# Patient Record
Sex: Male | Born: 1975 | Race: Black or African American | Hispanic: No | Marital: Married | Smoking: Former smoker
Health system: Southern US, Community
[De-identification: ages and names within clinical notes are randomized; demographics above are authoritative.]

## PROBLEM LIST (undated history)

## (undated) DIAGNOSIS — M5090 Cervical disc disorder, unspecified, unspecified cervical region: Secondary | ICD-10-CM

## (undated) DIAGNOSIS — R9431 Abnormal electrocardiogram [ECG] [EKG]: Secondary | ICD-10-CM

## (undated) DIAGNOSIS — R030 Elevated blood-pressure reading, without diagnosis of hypertension: Secondary | ICD-10-CM

## (undated) DIAGNOSIS — R600 Localized edema: Secondary | ICD-10-CM

## (undated) DIAGNOSIS — Z87891 Personal history of nicotine dependence: Secondary | ICD-10-CM

## (undated) DIAGNOSIS — E785 Hyperlipidemia, unspecified: Secondary | ICD-10-CM

## (undated) HISTORY — DX: Abnormal electrocardiogram (ECG) (EKG): R94.31

## (undated) HISTORY — DX: Localized edema: R60.0

## (undated) HISTORY — DX: Personal history of nicotine dependence: Z87.891

## (undated) HISTORY — DX: Elevated blood-pressure reading, without diagnosis of hypertension: R03.0

## (undated) HISTORY — DX: Hyperlipidemia, unspecified: E78.5

## (undated) HISTORY — DX: Cervical disc disorder, unspecified, unspecified cervical region: M50.90

---

## 1998-06-23 ENCOUNTER — Emergency Department (HOSPITAL_COMMUNITY): Admission: EM | Admit: 1998-06-23 | Discharge: 1998-06-23 | Payer: Self-pay | Admitting: Emergency Medicine

## 2001-09-02 ENCOUNTER — Encounter: Payer: Self-pay | Admitting: Specialist

## 2001-09-02 ENCOUNTER — Encounter: Admission: RE | Admit: 2001-09-02 | Discharge: 2001-09-02 | Payer: Self-pay | Admitting: Specialist

## 2005-02-17 ENCOUNTER — Emergency Department (HOSPITAL_COMMUNITY): Admission: EM | Admit: 2005-02-17 | Discharge: 2005-02-17 | Payer: Self-pay | Admitting: Emergency Medicine

## 2010-03-27 DIAGNOSIS — M5090 Cervical disc disorder, unspecified, unspecified cervical region: Secondary | ICD-10-CM

## 2010-03-27 HISTORY — PX: CERVICAL DISCECTOMY: SHX98

## 2010-03-27 HISTORY — DX: Cervical disc disorder, unspecified, unspecified cervical region: M50.90

## 2010-11-26 DIAGNOSIS — R600 Localized edema: Secondary | ICD-10-CM

## 2010-11-26 HISTORY — DX: Localized edema: R60.0

## 2010-12-26 DIAGNOSIS — R9431 Abnormal electrocardiogram [ECG] [EKG]: Secondary | ICD-10-CM

## 2010-12-26 HISTORY — DX: Abnormal electrocardiogram (ECG) (EKG): R94.31

## 2011-01-02 ENCOUNTER — Emergency Department (HOSPITAL_COMMUNITY): Payer: Commercial Managed Care - PPO

## 2011-01-02 ENCOUNTER — Observation Stay (HOSPITAL_COMMUNITY)
Admission: EM | Admit: 2011-01-02 | Discharge: 2011-01-04 | DRG: 948 | Disposition: A | Discharge status: Home / Self Care | Payer: Commercial Managed Care - PPO | Attending: Vascular Surgery | Admitting: Vascular Surgery

## 2011-01-02 DIAGNOSIS — R609 Edema, unspecified: Principal | ICD-10-CM | POA: Diagnosis present

## 2011-01-02 DIAGNOSIS — F17200 Nicotine dependence, unspecified, uncomplicated: Secondary | ICD-10-CM | POA: Diagnosis present

## 2011-01-02 DIAGNOSIS — M7989 Other specified soft tissue disorders: Secondary | ICD-10-CM

## 2011-01-02 LAB — CBC
HCT: 41.6 % (ref 39.0–52.0)
Hemoglobin: 14.9 g/dL (ref 13.0–17.0)
MCH: 30.9 pg (ref 26.0–34.0)
MCHC: 35.8 g/dL (ref 30.0–36.0)
MCV: 86.3 fL (ref 78.0–100.0)
Platelets: 179 10*3/uL (ref 150–400)
RDW: 13.3 % (ref 11.5–15.5)
WBC: 3.6 10*3/uL — ABNORMAL LOW (ref 4.0–10.5)

## 2011-01-03 ENCOUNTER — Inpatient Hospital Stay (HOSPITAL_COMMUNITY): Payer: Commercial Managed Care - PPO

## 2011-01-03 DIAGNOSIS — M7989 Other specified soft tissue disorders: Secondary | ICD-10-CM

## 2011-01-03 LAB — HEPARIN LEVEL (UNFRACTIONATED)
Heparin Unfractionated: 0.93 IU/mL — ABNORMAL HIGH (ref 0.30–0.70)
Heparin Unfractionated: <0.1 IU/mL — ABNORMAL LOW (ref 0.30–0.70)

## 2011-01-03 LAB — BASIC METABOLIC PANEL
BUN: 15 mg/dL (ref 6–23)
CO2: 26 mEq/L (ref 19–32)
Calcium: 9.4 mg/dL (ref 8.4–10.5)
Chloride: 100 mEq/L (ref 96–112)
Creatinine, Ser: 0.83 mg/dL (ref 0.50–1.35)
GFR calc Af Amer: >90 mL/min (ref >90)
GFR calc non Af Amer: >90 mL/min (ref >90)
Glucose, Bld: 118 mg/dL — ABNORMAL HIGH (ref 70–99)
Potassium: 3.7 mEq/L (ref 3.5–5.1)
Sodium: 136 mEq/L (ref 135–145)

## 2011-01-03 LAB — PROTIME-INR: INR: 0.91 (ref 0.00–1.49)

## 2011-01-03 LAB — HOMOCYSTEINE: Homocysteine: 10.8 umol/L (ref 4.0–15.4)

## 2011-01-03 LAB — APTT: aPTT: 32 seconds (ref 24–37)

## 2011-01-03 MED ORDER — IOHEXOL 350 MG/ML SOLN
120.0000 mL | Freq: Once | INTRAVENOUS | Status: AC | PRN
  Administered 2011-01-03: 120 mL via INTRAVENOUS

## 2011-01-04 LAB — PROTEIN C, TOTAL: Protein C, Total: 141 % (ref 72–160)

## 2011-01-05 ENCOUNTER — Encounter: Payer: Self-pay | Admitting: Medical

## 2011-01-05 LAB — PROTEIN C ACTIVITY: Protein C Activity: 165 % — ABNORMAL HIGH (ref 75–133)

## 2011-01-05 LAB — LUPUS ANTICOAGULANT PANEL
Lupus Anticoagulant: NOT DETECTED
PTT Lupus Anticoagulant: 40 secs (ref 30.0–45.6)

## 2011-01-05 LAB — ANTITHROMBIN III: AntiThromb III Func: 87 % (ref 76–126)

## 2011-01-05 LAB — PROTEIN S ACTIVITY: Protein S Activity: 111 % (ref 69–129)

## 2011-01-06 LAB — FACTOR 5 LEIDEN

## 2011-01-09 ENCOUNTER — Emergency Department (HOSPITAL_COMMUNITY)
Admission: EM | Admit: 2011-01-09 | Discharge: 2011-01-09 | Disposition: A | Discharge status: Home / Self Care | Payer: Commercial Managed Care - PPO | Attending: Emergency Medicine | Admitting: Emergency Medicine

## 2011-01-09 ENCOUNTER — Ambulatory Visit: Payer: Commercial Managed Care - PPO | Admitting: Medical

## 2011-01-09 DIAGNOSIS — M542 Cervicalgia: Secondary | ICD-10-CM | POA: Insufficient documentation

## 2011-01-09 LAB — PROTHROMBIN GENE MUTATION

## 2011-01-09 NOTE — Consult Note (Signed)
[name], [name]           ACCOUNT NO.:  [id number]  MEDICAL RECORD NO.:  [id number]  LOCATION:  2007                         FACILITY:  [facility]  PHYSICIAN:  Troy Frankel. Charlann Lang, M.D.  DATE OF BIRTH:  Aug 12, 1975  DATE OF CONSULTATION:  01/03/2011 DATE OF DISCHARGE:                                CONSULTATION   REASON FOR CONSULTATION:  Unexplained right upper extremity swelling.  BRIEF HISTORY:  Troy Lang is a 35 year old male seen at the request of Vascular Surgery after he presented to the hospital yesterday with 24- hour episode of right upper extremity swelling after doing exercises. The patient was seen evaluated by Vascular Surgery and had an extensive and thorough workup to rule out an effort-induced thrombosis.  This has been ruled out and at this point, there is no evidence of any vascular lymphatic condition that could result in his current situation.  He reports to me no pain, no reported trauma, no shoulder pains, no pectoralis pain, no biceps pain in antecubital area.  He has full range of motion of shoulder.  He has had no recent bug bites.  He has no fevers, chills, night sweats.  He does not note any erythema or warmth.  He has not had an episode like this in the past on either upper extremity or lower extremities.  PAST MEDICAL HISTORY:  Negative.  PAST SURGICAL HISTORY:  Negative.  SOCIAL HISTORY:  He has occasional tobacco and negative alcohol.  He is married.  REVIEW OF SYSTEMS:  Per his admitting note with Vascular Surgery, there are no fevers, chills, night sweats, or other constitutional symptoms.  PHYSICAL EXAMINATION:  GENERAL:  He is awake, alert, and oriented seen at the hospital on room 2007 at [facility] VITAL SIGNS.  Stable.  He is afebrile. EXTREMITIES:  His right upper extremity compared to the left does reveal some asymmetric swelling, but no pitting edema.  He demonstrates full shoulder range of motion with minimal tenderness to palpation.   Normal elbow range of motion with no pain.  He has symmetric skin temperature on both the right and left upper extremities.  Grip strength normal.  He does have a bandage from previous catheterization for the assessment of his right subclavian region.  ASSESSMENT:  Unexplained right upper extremity swelling with an uncertain etiology, ruled out vascular condition.  PLAN:  At this point, I did not have a obvious musculoskeletal reason to explain why he would have this other than perhaps reactive inflammatory response to his exercise.  Other than continued observation, resting his upper extremity, he does not need to be treated in a sling, perhaps a 12- day tapered course of prednisone may alleviate some of this inflammatory process.  I will start him on that tonight.  I did discuss this with Dr. Darrick Penna.  We planned at this point to see him back in the office in 3 weeks, understanding that if his symptoms have improved, he can cancel the appointment only unless it recurs.  Further workup outpoint at this point would still be uncertain unless we are to evaluate or be able to identify an isolated area of pain that would focus our attention.  Questions were encouraged and reviewed with  him today.     Troy Lang, M.D.     MDO/MEDQ  D:  01/03/2011  T:  01/04/2011  Job:  [id number]  Electronically Signed by Durene Romans M.D. on 01/09/2011 08:49:57 AM

## 2011-01-09 NOTE — H&P (Signed)
  Troy Lang, [name]           ACCOUNT NO.:  [id number]  MEDICAL RECORD NO.:  [id number]  LOCATION:  2007                         FACILITY:  [facility]  PHYSICIAN:  Janetta Hora. Camarion Weier, MD  DATE OF BIRTH:  [birth year]  DATE OF ADMISSION:  01/02/2011 DATE OF DISCHARGE:                             HISTORY & PHYSICAL   CHIEF COMPLAINT:  Right upper extremity swelling.  HISTORY OF PRESENT ILLNESS:  The patient is a 35 year old male who has approximately 24-hour history of swelling in the right upper extremity. The patient has been doing upper body workouts at the gym for the last week and noticed his right arm began swelling yesterday.  He was seen by an Urgent Care earlier this evening and was transferred to [facility] for further evaluation.  He denies history of recent or prior trauma to the right arm or neck.  He has no family history of thrombophilia or effort thrombosis.  He denies pain or numbness in the arm.  He has no other medical problems except possible history of elevated cholesterol.  MEDICATIONS:  None.  ALLERGIES:  No known drug allergies.  SOCIAL HISTORY:  Occasional tobacco use.  Denies alcohol or drug use. He is married.  FAMILY HISTORY:  As above.  REVIEW OF SYSTEMS:  Denies history of GI bleeding, shortness of breath, chest pain.  HEENT:  Negative.  Denies history of skin rash. Denies recent weight loss or gain or fevers.  Denies musculoskeletal deformity or arthritis.  PHYSICAL EXAMINATION:  VITAL SIGNS:  Heart rate 86, blood pressure 126/76, respirations 16, temperature 98. GENERAL:  He is a black male, in no acute distress.  He is alert and oriented x3. HEENT:  Negative. NECK:  Has 2+ carotid pulses with no jugular venous distention. CHEST:  Clear to auscultation bilaterally with no collaterals. CARDIAC:  Regular rate and rhythm. ABDOMEN:  Soft, nontender, nondistended.  No masses. SKIN:  Has no rash. EXTREMITIES:  He has 2+ femoral pulses  and 2+ radial pulses bilaterally. Right upper extremity is diffusely swollen from the hand all the way to the level of the shoulder. NEUROLOGIC: Upper extremity and lower extremity is 5/5 motor with symmetric exam.  Sensation is intact.  ASSESSMENT:  Effort thrombosis, right upper extremity.  PLAN: 1. CT venogram of the chest to rule out cervical ribs as well as other     causes of possible axillary thrombosis and make sure this is not     related to mass effect. 2. Heparin drip. 3. We will make him n.p.o. after midnight for possible lysis in the     morning. 4. He will most likely need transfer to [facility] to be evaluated by Dr.     Ewing Schlein for possible thoracic outlet decompression at some point.     We will also order a  hypercoagulable panel this evening.     Janetta Hora. Galia Rahm, MD     CEF/MEDQ  D:  01/02/2011  T:  01/03/2011  Job:  [id number]  Electronically Signed by Fabienne Bruns MD on 01/09/2011 12:53:22 PM

## 2011-01-09 NOTE — H&P (Signed)
  Troy Lang, Troy Lang           ACCOUNT NO.:  [id number]  MEDICAL RECORD NO.:  [id number]  LOCATION:  [facility]                         FACILITY:  [facility]  PHYSICIAN:  Janetta Hora. Rivers Hamrick, MD  DATE OF BIRTH:  1975-10-24  DATE OF ADMISSION:  01/02/2011 DATE OF DISCHARGE:                             HISTORY & PHYSICAL   CHIEF COMPLAINT:  Right upper extremity swelling.  HISTORY OF PRESENT ILLNESS:  The patient is a 35 year old male with approximately 24-hour history of swelling in his right upper extremity. The patient has been doing upper body workouts over the last week at the gym.  He noticed right arm began to swell yesterday.  No prior trauma to the arm or neck.  He has no family history of thrombophilia or effort thrombosis.  __________ problems or history of possible elevation of cholesterol.   Dictation ended at this point.     Janetta Hora. Nalda Shackleford, MD     CEF/MEDQ  D:  01/02/2011  T:  01/02/2011  Job:  [id number]  Electronically Signed by Fabienne Bruns MD on 01/09/2011 12:53:31 PM

## 2011-01-13 LAB — BETA-2-GLYCOPROTEIN I ABS, IGG/M/A
Beta-2 Glyco I IgG: 0 G Units (ref <20)
Beta-2-Glycoprotein I IgA: 3 A Units (ref <20)
Beta-2-Glycoprotein I IgM: 5 M Units (ref <20)

## 2011-01-13 LAB — CARDIOLIPIN ANTIBODIES, IGG, IGM, IGA
Anticardiolipin IgA: 10 APL U/mL — ABNORMAL LOW (ref <22)
Anticardiolipin IgG: 4 GPL U/mL — ABNORMAL LOW (ref <23)
Anticardiolipin IgM: 3 MPL U/mL — ABNORMAL LOW (ref <11)

## 2011-01-23 ENCOUNTER — Encounter: Payer: Self-pay | Admitting: Medical

## 2011-01-23 ENCOUNTER — Ambulatory Visit (INDEPENDENT_AMBULATORY_CARE_PROVIDER_SITE_OTHER): Payer: Commercial Managed Care - PPO | Admitting: Medical

## 2011-01-23 VITALS — BP 130/88 | HR 72 | Temp 98.0°F | Resp 12 | Ht 76.0 in | Wt 222.0 lb

## 2011-01-23 DIAGNOSIS — G479 Sleep disorder, unspecified: Secondary | ICD-10-CM

## 2011-01-23 DIAGNOSIS — R404 Transient alteration of awareness: Secondary | ICD-10-CM

## 2011-01-23 DIAGNOSIS — Z0000 Encounter for general adult medical examination without abnormal findings: Secondary | ICD-10-CM

## 2011-01-23 DIAGNOSIS — R0683 Snoring: Secondary | ICD-10-CM

## 2011-01-23 DIAGNOSIS — Z113 Encounter for screening for infections with a predominantly sexual mode of transmission: Secondary | ICD-10-CM

## 2011-01-23 DIAGNOSIS — R0609 Other forms of dyspnea: Secondary | ICD-10-CM

## 2011-01-23 DIAGNOSIS — R400 Somnolence: Secondary | ICD-10-CM | POA: Insufficient documentation

## 2011-01-23 LAB — CBC WITH DIFFERENTIAL/PLATELET
Basophils Relative: 0 % (ref 0–1)
Eosinophils Absolute: 0.1 10*3/uL (ref 0.0–0.7)
HCT: 46 % (ref 39.0–52.0)
Hemoglobin: 16.1 g/dL (ref 13.0–17.0)
Lymphs Abs: 1.5 10*3/uL (ref 0.7–4.0)
MCH: 30.5 pg (ref 26.0–34.0)
MCHC: 35 g/dL (ref 30.0–36.0)
Monocytes Absolute: 0.6 10*3/uL (ref 0.1–1.0)
Monocytes Relative: 19 % — ABNORMAL HIGH (ref 3–12)
Neutrophils Relative %: 29 % — ABNORMAL LOW (ref 43–77)
RBC: 5.28 MIL/uL (ref 4.22–5.81)

## 2011-01-23 LAB — POCT URINALYSIS DIPSTICK
Bilirubin, UA: NEGATIVE
Ketones, UA: NEGATIVE
Leukocytes, UA: NEGATIVE
Spec Grav, UA: 1.02

## 2011-01-23 NOTE — Patient Instructions (Signed)
Preventative Care for Adults, Male       REGULAR HEALTH EXAMS:  A routine yearly physical is a good way to check in with your primary care provider about your health and preventive screening. It is also an opportunity to share updates about your health and any concerns you have, and receive a thorough all-over exam.   Most health insurance companies pay for at least some preventative services.  Check with your health plan for specific coverages.  WHAT PREVENTATIVE SERVICES DO MEN NEED?  Adult men should have their weight and blood pressure checked regularly.   Men age 35 and older should have their cholesterol levels checked regularly.  Beginning at age 50 and continuing to age 75, men should be screened for colorectal cancer.  Certain people should may need continued testing until age 85.  Other cancer screening may include exams for testicular and prostate cancer.  Updating vaccinations is part of preventative care.  Vaccinations help protect against diseases such as the flu.  Lab tests are generally done as part of preventative care to screen for anemia and blood disorders, to screen for problems with the kidneys and liver, to screen for bladder problems, to check blood sugar, and to check your cholesterol level.  Preventative services generally include counseling about diet, exercise, avoiding tobacco, drugs, excessive alcohol consumption, and sexually transmitted infections.    GENERAL RECOMMENDATIONS FOR GOOD HEALTH:  Healthy diet:  Eat a variety of foods, including fruit, vegetables, animal or vegetable protein, such as meat, fish, chicken, and eggs, or beans, lentils, tofu, and grains, such as rice.  Drink plenty of water daily.  Decrease saturated fat in the diet, avoid lots of red meat, processed foods, sweets, fast foods, and fried foods.  Exercise:  Aerobic exercise helps maintain good heart health. At least 30-40 minutes of moderate-intensity exercise is recommended.  For example, a brisk walk that increases your heart rate and breathing. This should be done on most days of the week.   Find a type of exercise or a variety of exercises that you enjoy so that it becomes a part of your daily life.  Examples are running, walking, swimming, water aerobics, and biking.  For motivation and support, explore group exercise such as aerobic class, spin class, Zumba, Yoga,or  martial arts, etc.    Set exercise goals for yourself, such as a certain weight goal, walk or run in a race such as a 5k walk/run.  Speak to your primary care provider about exercise goals.  Disease prevention:  If you smoke or chew tobacco, find out from your caregiver how to quit. It can literally save your life, no matter how long you have been a tobacco user. If you do not use tobacco, never begin.   Maintain a healthy diet and normal weight. Increased weight leads to problems with blood pressure and diabetes.   The Body Mass Index or BMI is a way of measuring how much of your body is fat. Having a BMI above 27 increases the risk of heart disease, diabetes, hypertension, stroke and other problems related to obesity. Your caregiver can help determine your BMI and based on it develop an exercise and dietary program to help you achieve or maintain this important measurement at a healthful level.  High blood pressure causes heart and blood vessel problems.  Persistent high blood pressure should be treated with medicine if weight loss and exercise do not work.   Fat and cholesterol leaves deposits in your arteries   that can block them. This causes heart disease and vessel disease elsewhere in your body.  If your cholesterol is found to be high, or if you have heart disease or certain other medical conditions, then you may need to have your cholesterol monitored frequently and be treated with medication.   Ask if you should have a stress test if your history suggests this. A stress test is a test done on  a treadmill that looks for heart disease. This test can find disease prior to there being a problem.  Avoid drinking alcohol in excess (more than two drinks per day).  Avoid use of street drugs. Do not share needles with anyone. Ask for professional help if you need assistance or instructions on stopping the use of alcohol, cigarettes, and/or drugs.  Brush your teeth twice a day with fluoride toothpaste, and floss once a day. Good oral hygiene prevents tooth decay and gum disease. The problems can be painful, unattractive, and can cause other health problems. Visit your dentist for a routine oral and dental check up and preventive care every 6-12 months.   Look at your skin regularly.  Use a mirror to look at your back. Notify your caregivers of changes in moles, especially if there are changes in shapes, colors, a size larger than a pencil eraser, an irregular border, or development of new moles.  Safety:  Use seatbelts 100% of the time, whether driving or as a passenger.  Use safety devices such as hearing protection if you work in environments with loud noise or significant background noise.  Use safety glasses when doing any work that could send debris in to the eyes.  Use a helmet if you ride a bike or motorcycle.  Use appropriate safety gear for contact sports.  Talk to your caregiver about gun safety.  Use sunscreen with a SPF (or skin protection factor) of 15 or greater.  Lighter skinned people are at a greater risk of skin cancer. Don't forget to also wear sunglasses in order to protect your eyes from too much damaging sunlight. Damaging sunlight can accelerate cataract formation.   Practice safe sex. Use condoms. Condoms are used for birth control and to help reduce the spread of sexually transmitted infections (or STIs).  Some of the STIs are gonorrhea (the clap), chlamydia, syphilis, trichomonas, herpes, HPV (human papilloma virus) and HIV (human immunodeficiency virus) which causes AIDS.  The herpes, HIV and HPV are viral illnesses that have no cure. These can result in disability, cancer and death.   Keep carbon monoxide and smoke detectors in your home functioning at all times. Change the batteries every 6 months or use a model that plugs into the wall.   Vaccinations:  Stay up to date with your tetanus shots and other required immunizations. You should have a booster for tetanus every 10 years. Be sure to get your flu shot every year, since 5%-20% of the [place] population comes down with the flu. The flu vaccine changes each year, so being vaccinated once is not enough. Get your shot in the fall, before the flu season peaks.   Other vaccines to consider:  Pneumococcal vaccine to protect against certain types of pneumonia.  This is normally recommended for adults age 65 or older.  However, adults younger than 35 years old with certain underlying conditions such as diabetes, heart or lung disease should also receive the vaccine.  Shingles vaccine to protect against Varicella Zoster if you are older than age 60, or younger   than 35 years old with certain underlying illness.  Hepatitis A vaccine to protect against a form of infection of the liver by a virus acquired from food.  Hepatitis B vaccine to protect against a form of infection of the liver by a virus acquired from blood or body fluids, particularly if you work in health care.  If you plan to travel internationally, check with your local health department for specific vaccination recommendations.  Cancer Screening:  Most routine colon cancer screening begins at the age of 50. On a yearly basis, doctors may provide special easy to use take-home tests to check for hidden blood in the stool. Sigmoidoscopy or colonoscopy can detect the earliest forms of colon cancer and is life saving. These tests use a small camera at the end of a tube to directly examine the colon. Speak to your caregiver about this at age 50, when routine  screening begins (and is repeated every 5 years unless early forms of pre-cancerous polyps or small growths are found).   At the age of 50 men usually start screening for prostate cancer every year. Screening may begin at a younger age for those with higher risk. Those at higher risk include African-Americans or having a family history of prostate cancer. There are two types of tests for prostate cancer:   Prostate-specific antigen (PSA) testing. Recent studies raise questions about prostate cancer using PSA and you should discuss this with your caregiver.   Digital rectal exam (in which your doctor's lubricated and gloved finger feels for enlargement of the prostate through the anus).   Screening for testicular cancer.  Do a monthly exam of your testicles. Gently roll each testicle between your thumb and fingers, feeling for any abnormal lumps. The best time to do this is after a hot shower or bath when the tissues are looser. Notify your caregivers of any lumps, tenderness or changes in size or shape immediately.     

## 2011-01-23 NOTE — Progress Notes (Signed)
Subjective:   HPI  Troy Lang is a 35 y.o. male who presents for a complete physical.  He is a new patient today.  His wife established with me as a new patient recently.  He is originally from [place], [place].  He has lived here since 1998.  He has been in his usual state of health until September.   He began having right arm swelling and neck pain, was hospitalized in September for edema, suspected DVT, but ultimately was found to have cervical disc disease.  He is currently on medication per [facility], and has surgery planned in the next few weeks.     His wife is with him today and notes that he has had bad snoring problems for at least  5 years that is worsening. He feels fatigue, has day time somnolence, doesn't feel rested, and wife thinks he has apnea episodes.   Nurse at the hospital recommended he have a sleep study.    Reviewed their medical, surgical, family, social, medication, and allergy history and updated chart as appropriate.  Past Medical History  Diagnosis Date  . Hyperlipidemia   . Cervical disc disease     Dr. Shon Baton  . Edema of upper extremity 9/12    hospitalization for edema  . Former smoker     History reviewed. No pertinent past surgical history.  Family History  Problem Relation Age of Onset  . Diabetes Mother   . Hypertension Mother   . Diabetes Father   . Hypertension Father   . Diabetes Brother   . Cancer Neg Hx   . COPD Neg Hx   . Heart disease Neg Hx   . Stroke Neg Hx     History   Social History  . Marital Status: Married    Spouse Name: N/A    Number of Children: N/A  . Years of Education: N/A   Occupational History  . Publishing rights manager   Social History Main Topics  . Smoking status: Former Smoker -- 0.5 packs/day for 10 years    Quit date: 09/23/2010  . Smokeless tobacco: Never Used   Comment: pt states that he has smoke one or 2 after that  . Alcohol Use: No  . Drug Use: No  . Sexually  Active: Not on file   Other Topics Concern  . 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    No Known Allergies     Review of Systems Constitutional: -fever, -chills, -sweats, -unexpected weight change, -anorexia, -fatigue Allergy: -sneezing, -itching, -congestion Dermatology: denies changing moles, rash, lumps, new worrisome lesions ENT: -runny nose, -ear pain, -sore throat, -hoarseness, -sinus pain, -teeth pain, -tinnitus, -hearing loss, -epistaxis Cardiology:  -chest pain, -palpitations, -edema, -orthopnea, -paroxysmal nocturnal dyspnea Respiratory: -cough, -shortness of breath, -dyspnea on exertion, -wheezing, -hemoptysis Gastroenterology: -abdominal pain, -nausea, -vomiting, -diarrhea, -constipation, -blood in stool, -changes in bowel movement, -dysphagia Hematology: -bleeding or bruising problems Musculoskeletal: -arthralgias, -myalgias, -joint swelling, -back pain, +neck pain, -cramping, -gait changes Ophthalmology: -vision changes, -eye redness, -itching, -discharge Urology: -dysuria, -difficulty urinating, -hematuria, -urinary frequency, -urgency, incontinence Neurology: -headache, -weakness, +tingling, +numbness, -speech abnormality, -memory loss, -falls, -dizziness Psychology:  -depressed mood, -agitation, -sleep problems     Objective:   Physical Exam  Filed Vitals:   01/23/11 1011  BP: 130/88  Pulse: 72  Temp: 98  F (36.7 C)  Resp: 12    General appearance: alert, no distress, WD/WN, black male, mildly overweight Skin: unremarkable, no worrisome lesions HEENT: normocephalic, conjunctiva/corneas normal, sclerae anicteric, PERRLA, EOMi, nares patent, no discharge or erythema, pharynx normal Oral cavity: MMM, tongue normal, teeth normal Neck: supple, no lymphadenopathy, no thyromegaly, no masses, mild  generalized tenderness, decreased generalized ROM, no bruits Chest: non tender, normal shape and expansion Heart: RRR, normal S1, S2, no murmurs Lungs: CTA bilaterally, no wheezes, rhonchi, or rales Abdomen: +bs, soft, non tender, non distended, no masses, no hepatomegaly, no splenomegaly, no bruits Back: non tender, normal ROM, no scoliosis Musculoskeletal: upper extremities non tender, no obvious deformity, normal ROM throughout, lower extremities non tender, no obvious deformity, normal ROM throughout Extremities: no edema, no cyanosis, no clubbing Pulses: 2+ symmetric, upper and lower extremities, normal cap refill Neurological: alert, oriented x 3, CN2-12 intact, strength normal upper extremities and lower extremities, sensation normal throughout, DTRs 2+ throughout, no cerebellar signs, gait normal Psychiatric: normal affect, behavior normal, pleasant  GU: normal male external genitalia, nontender, no masses, no hernia, no lymphadenopathy Rectal: deferred, no risk factors for early screening.   Assessment and Plan :    Encounter Diagnoses  Name Primary?  . Routine general medical examination at a health care facility Yes  . Snoring   . Somnolence   . Sleep disorder   . Screening for STD (sexually transmitted disease)   . General medical examination    Physical exam - discussed healthy lifestyle, diet, exercise, preventative care, vaccinations, and addressed their concerns.  He had flu shot at hospital in September.   He is UTD on tetanus booster per his report within last 7 years.   Snoring, somnolence, sleep disorder - likely sleep apnea.  He will call back after his neck surgery to schedule a sleep study.    STD screening today, routine.    Follow-up pending labs.  I will sign off on clearance letter to [facility] pending labs and records from recent hospitalization.   Of note, I reviewed recent hospital records, labs, EKG. The only EKG report I have is telemetry  leads.  Recent labs at the hospital were primarily negative workup for suspected thrombosis.

## 2011-01-24 ENCOUNTER — Other Ambulatory Visit (INDEPENDENT_AMBULATORY_CARE_PROVIDER_SITE_OTHER): Payer: Commercial Managed Care - PPO

## 2011-01-24 ENCOUNTER — Other Ambulatory Visit: Payer: Self-pay | Admitting: Medical

## 2011-01-24 DIAGNOSIS — Z79899 Other long term (current) drug therapy: Secondary | ICD-10-CM

## 2011-01-24 LAB — LIPID PANEL
Cholesterol: 268 mg/dL — ABNORMAL HIGH (ref 0–200)
HDL: 55 mg/dL (ref >39)
Total CHOL/HDL Ratio: 4.9 Ratio
VLDL: 24 mg/dL (ref 0–40)

## 2011-01-24 LAB — TSH: TSH: 1.582 u[IU]/mL (ref 0.350–4.500)

## 2011-01-24 LAB — COMPREHENSIVE METABOLIC PANEL
AST: 22 U/L (ref 0–37)
Albumin: 4.6 g/dL (ref 3.5–5.2)
Alkaline Phosphatase: 69 U/L (ref 39–117)
BUN: 15 mg/dL (ref 6–23)
Creat: 0.85 mg/dL (ref 0.50–1.35)
Glucose, Bld: 94 mg/dL (ref 70–99)
Total Bilirubin: 0.9 mg/dL (ref 0.3–1.2)

## 2011-01-24 LAB — PATHOLOGIST SMEAR REVIEW

## 2011-01-24 LAB — HIV ANTIBODY (ROUTINE TESTING W REFLEX): HIV: NONREACTIVE

## 2011-01-24 MED ORDER — ROSUVASTATIN CALCIUM 5 MG PO TABS: 5.0000 mg | ORAL_TABLET | Freq: Every day | ORAL | Status: DC

## 2011-01-24 MED ORDER — ROSUVASTATIN CALCIUM 10 MG PO TABS: 10.0000 mg | ORAL_TABLET | Freq: Every day | ORAL | Status: DC

## 2011-01-24 NOTE — Progress Notes (Signed)
Subjective:   HPI  Troy Lang is a 35 y.o. male who presents at my request for EKG only.  I saw him yesterday for new patient physical. He is awaiting surgery clearance from me to have orthopedic surgery with Dr. Shon Baton at [facility] for cervical disc disease.  I asked him to return for EKG due to the fact that telemetry reading from his October hospitalization appeared somewhat abnormal.  He is here to discuss his lab tests from yesterday as well.   No other c/o.  The following portions of the patient's history were reviewed and updated as appropriate: allergies, current medications, past family history, past medical history, past social history, past surgical history and problem list.  Past Medical History  Diagnosis Date  . Hyperlipidemia   . Cervical disc disease     Dr. Shon Baton  . Edema of upper extremity 9/12    hospitalization for edema  . Former smoker     No Known Allergies  Current Outpatient Prescriptions on File Prior to Visit  Medication Sig Dispense Refill  . gabapentin (NEURONTIN) 100 MG capsule Take 100 mg by mouth 3 (three) times daily.        . methocarbamol (ROBAXIN) 500 MG tablet Take 500 mg by mouth once.        Marland Kitchen oxyCODONE-acetaminophen (PERCOCET) 5-325 MG per tablet Take 1 tablet by mouth every 4 (four) hours as needed.         No past surgical history on file.    Adult ECG Report  Indication: surgery clearance  Rate: 85 bpm  Rhythm: normal sinus rhythm  QRS Axis: 11 degrees  PR Interval: 146 ms  QRS Duration: 88 ms  QTc: 395 ms  Conduction Disturbances: none  Other Abnormalities: T wave inversions V5, V6, P wave enlargement II  Patient's cardiac risk factors are: dyslipidemia and male gender.  EKG comparison: telemetry leads from 10/12  Narrative Interpretation: T wave abnormality    Objective:   Physical Exam  General appearance: alert, no distress, WD/WN  Assessment and Plan :     Abnormal EKG Unexplained right arm  swelling in October Hyperlipidemia Pending surgery clearance  Of note, he is wanting his cervical disc surgery ASAP, but I reiterated that other steps are needed to clear for surgery.  I am awaiting additional records from [facility] from his recent October visit and to determine the etiology of the upper extremity swelling.  Dr. Susann Givens (supervising physician) and I discussed possible differential. I called [facility] to get records, I called [facility] to get records as I have none of their eval and records at this point.    I will begin him on Crestor 5mg  daily for hyperlipidemia.  recheck lipids in 29mo.  We called to get him appt with Dr. Donnie Aho for Friday, but apparently patient's wife called their office back and was able to get him an appointment today as a work in.

## 2011-01-25 ENCOUNTER — Encounter: Payer: Self-pay | Admitting: Medical

## 2011-01-25 ENCOUNTER — Institutional Professional Consult (permissible substitution): Payer: Commercial Managed Care - PPO | Admitting: Cardiology

## 2011-03-28 DIAGNOSIS — R030 Elevated blood-pressure reading, without diagnosis of hypertension: Secondary | ICD-10-CM

## 2011-03-28 HISTORY — DX: Elevated blood-pressure reading, without diagnosis of hypertension: R03.0

## 2011-08-08 ENCOUNTER — Other Ambulatory Visit: Payer: Self-pay | Admitting: Medical

## 2011-08-08 MED ORDER — ATORVASTATIN CALCIUM 40 MG PO TABS: 40.0000 mg | ORAL_TABLET | Freq: Every day | ORAL | Status: DC

## 2011-08-09 ENCOUNTER — Telehealth: Payer: Self-pay | Admitting: Family Medicine

## 2011-08-09 NOTE — Telephone Encounter (Signed)
Message copied by Janeice Robinson on Wed Aug 09, 2011  3:28 PM ------      Message from: Jac Canavan      Created: Tue Aug 08, 2011  8:22 PM       i will send Lipitor to pharmacy.  He is due back in 6wk recheck on cholesterol - OV and fasting labs.  His wife was in today and she notes the crestor was too expensive.  Thus, this should be much cheaper choice.

## 2011-08-09 NOTE — Telephone Encounter (Signed)
Patient was notified of his medicationand he needs to f/u in 6 weeks. cls

## 2011-10-05 ENCOUNTER — Telehealth: Payer: Self-pay | Admitting: Internal Medicine

## 2011-10-05 MED ORDER — MEFLOQUINE HCL 250 MG PO TABS: 250.0000 mg | ORAL_TABLET | ORAL | Status: AC

## 2011-10-05 NOTE — Telephone Encounter (Signed)
Mefloquine called in for a pending trip to [place]

## 2011-10-14 ENCOUNTER — Other Ambulatory Visit: Payer: Self-pay | Admitting: Medical

## 2011-10-16 NOTE — Telephone Encounter (Signed)
Patient needs to schedule an office visit to follow up on blood pressure.

## 2011-12-14 ENCOUNTER — Encounter: Payer: Self-pay | Admitting: Family Medicine

## 2011-12-14 ENCOUNTER — Ambulatory Visit (INDEPENDENT_AMBULATORY_CARE_PROVIDER_SITE_OTHER): Payer: Commercial Managed Care - PPO | Admitting: Family Medicine

## 2011-12-14 VITALS — BP 130/90 | HR 78 | Wt 230.0 lb

## 2011-12-14 DIAGNOSIS — Z833 Family history of diabetes mellitus: Secondary | ICD-10-CM

## 2011-12-14 DIAGNOSIS — E785 Hyperlipidemia, unspecified: Secondary | ICD-10-CM

## 2011-12-14 DIAGNOSIS — Z23 Encounter for immunization: Secondary | ICD-10-CM

## 2011-12-14 LAB — CBC WITH DIFFERENTIAL/PLATELET
Basophils Absolute: 0 10*3/uL (ref 0.0–0.1)
Basophils Relative: 1 % (ref 0–1)
Eosinophils Relative: 4 % (ref 0–5)
HCT: 42.7 % (ref 39.0–52.0)
Hemoglobin: 14.9 g/dL (ref 13.0–17.0)
MCHC: 34.9 g/dL (ref 30.0–36.0)
MCV: 86.4 fL (ref 78.0–100.0)
Monocytes Absolute: 0.4 10*3/uL (ref 0.1–1.0)
Monocytes Relative: 12 % (ref 3–12)
Neutro Abs: 0.9 10*3/uL — ABNORMAL LOW (ref 1.7–7.7)
RDW: 14.4 % (ref 11.5–15.5)

## 2011-12-14 LAB — LIPID PANEL
HDL: 38 mg/dL — ABNORMAL LOW (ref >39)
Total CHOL/HDL Ratio: 5.9 Ratio
Triglycerides: 160 mg/dL — ABNORMAL HIGH (ref <150)

## 2011-12-14 LAB — COMPREHENSIVE METABOLIC PANEL
Albumin: 4.3 g/dL (ref 3.5–5.2)
Alkaline Phosphatase: 68 U/L (ref 39–117)
BUN: 12 mg/dL (ref 6–23)
Calcium: 9.2 mg/dL (ref 8.4–10.5)
Chloride: 103 mEq/L (ref 96–112)
Creat: 0.96 mg/dL (ref 0.50–1.35)
Glucose, Bld: 95 mg/dL (ref 70–99)
Potassium: 4.1 mEq/L (ref 3.5–5.3)

## 2011-12-14 NOTE — Progress Notes (Signed)
  Subjective:    Patient ID: Troy Lang, male    DOB: 06/13/1975, 36 y.o.   MRN: [id number]  HPI He is here for a general checkup. He has a history of previous cervical this disease. Presently he is on no medications for this. 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 Was unaware that he needed to stay on this medication. There is question of sleep disturbance however further discussion indicates that he does snore but otherwise has no other sleep related issues specifically falling asleep easily in the wrong situations. He does have a family history of diabetes with both his mother and father having this. His work and home life are going quite well. He has no other concerns.   Review of Systems     Objective:   Physical Exam alert and in no distress. Tympanic membranes and canals are normal. Throat is clear. Tonsils are normal. Neck is supple without adenopathy or thyromegaly. Cardiac exam shows a regular sinus rhythm without murmurs or gallops. Lungs are clear to auscultation.        Assessment & Plan:   1. Hyperlipidemia LDL goal < 100   2. Family history of diabetes mellitus    flu shot will be given. Discussed the need for him to get back on a statin drug and recheck this in 2 months. Routine blood screening will be done concerning this.

## 2013-04-15 ENCOUNTER — Ambulatory Visit: Payer: Self-pay | Admitting: Medical

## 2013-05-19 IMAGING — CT CT ANGIO CHEST
2 of 6 series · 19 of 46 positions shown · IV contrast (omnipaque)
Comparison: None

CLINICAL DATA: Right arm swelling, pain.

CT ANGIOGRAPHY CHEST WITH CONTRAST
TECHNIQUE: Multidetector CT imaging of the chest was performed
using the standard protocol during bolus administration of
intravenous contrast.  Multiplanar CT image reconstructions
including MIPs were obtained to evaluate the vascular anatomy.
Contrast: 120mL OMNIPAQUE IOHEXOL 350 MG/ML IV SOLN

[Series 9: dissection 1.0 thins · axial · 0.93mm/px · z∈[-210,+111]mm · 16 of 699 slices shown]
[im 28/699  lung]
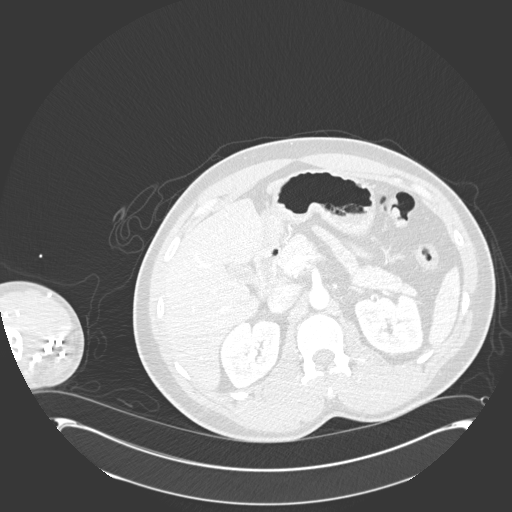
[im 84/699  soft-tissue]
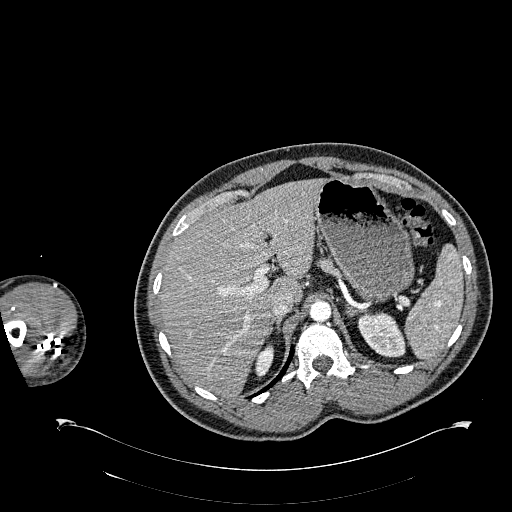
[im 112/699  lung]
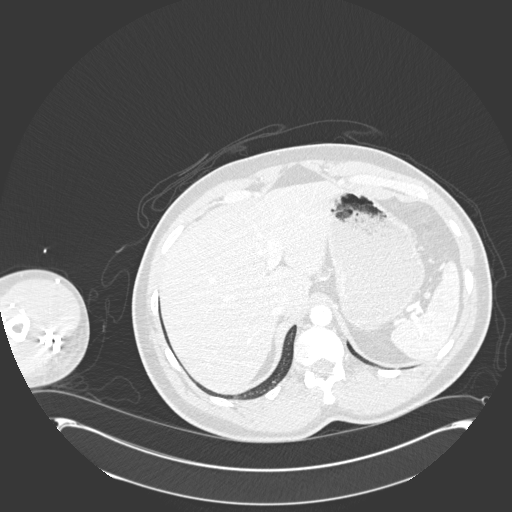
[im 168/699  soft-tissue]
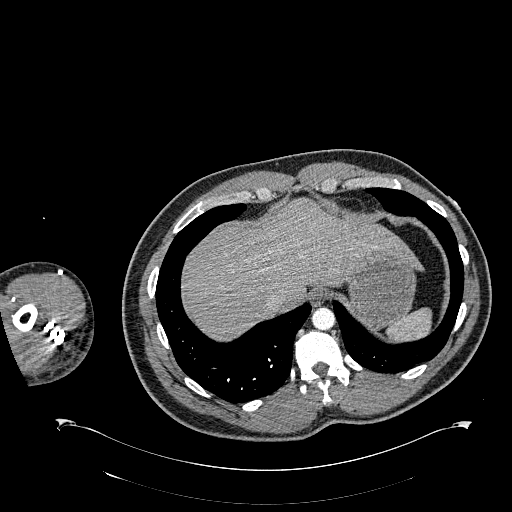
[im 196/699  lung]
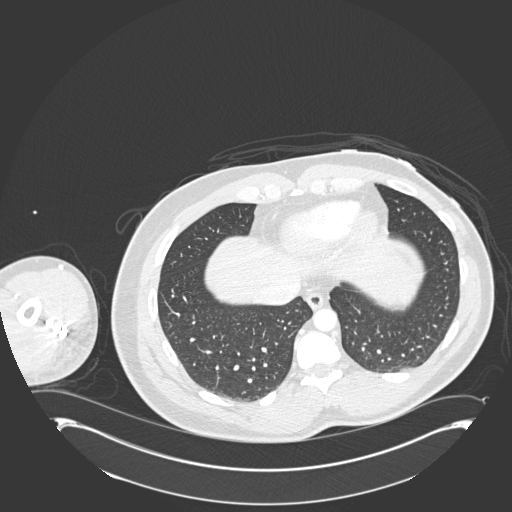
[im 252/699  soft-tissue]
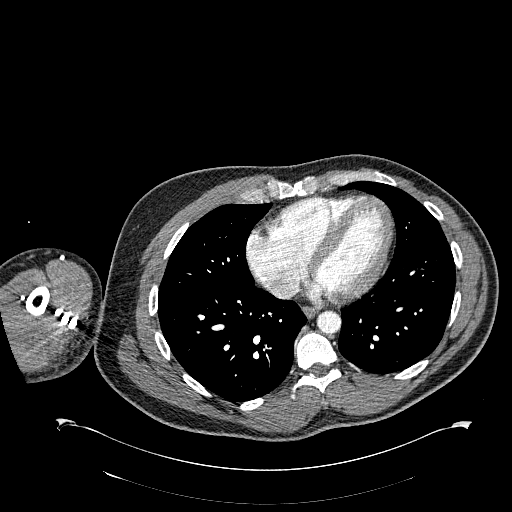
[im 280/699  lung]
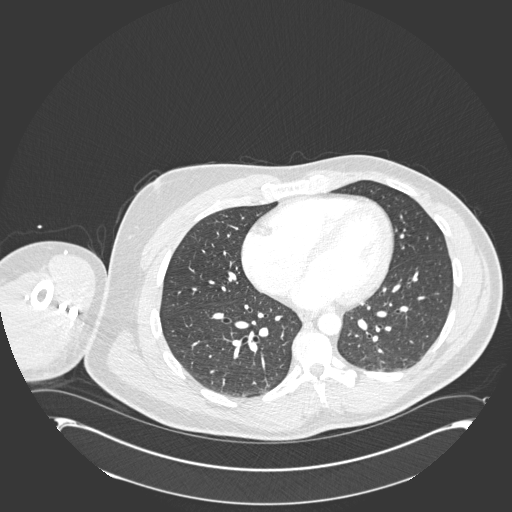
[im 336/699  soft-tissue]
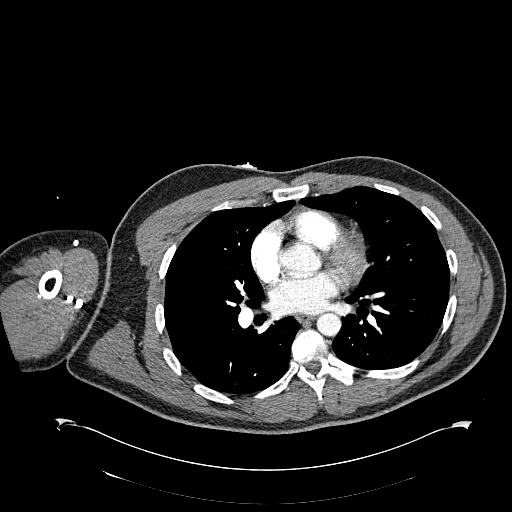
[im 363/699  lung]
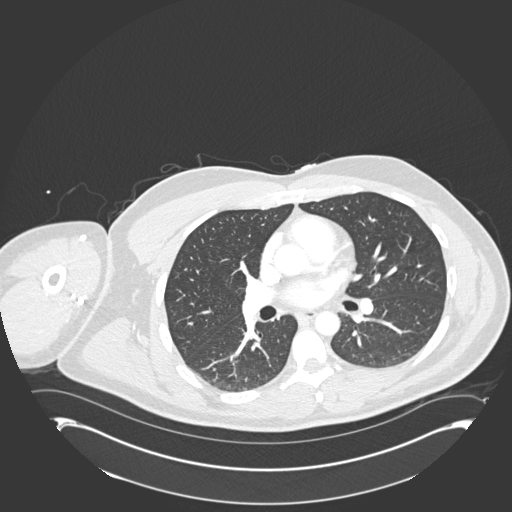
[im 419/699  soft-tissue]
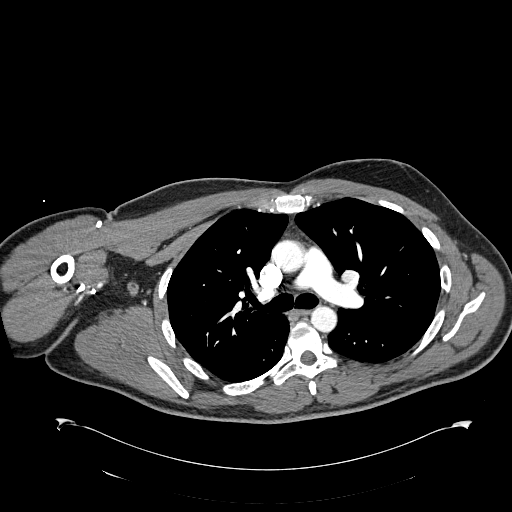
[im 447/699  lung]
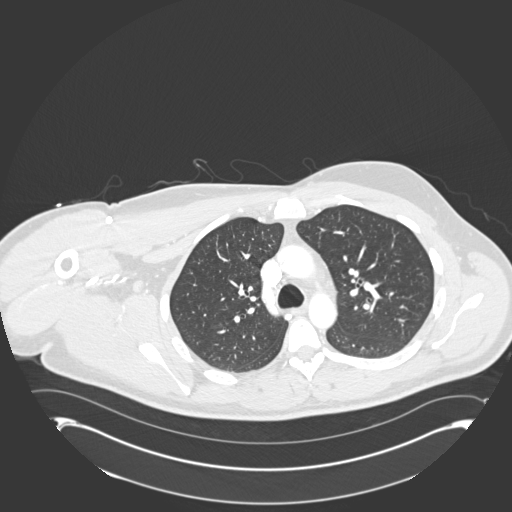
[im 503/699  soft-tissue]
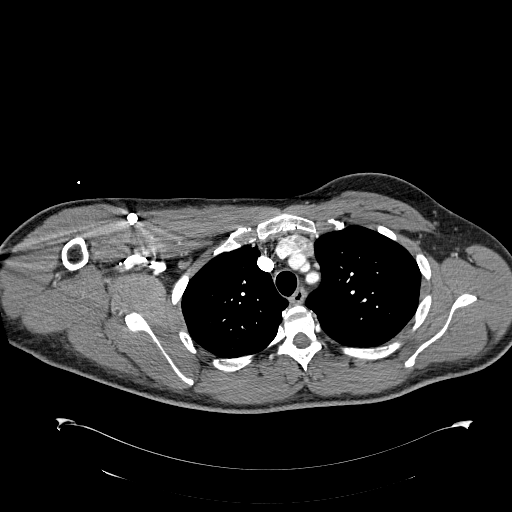
[im 531/699  lung]
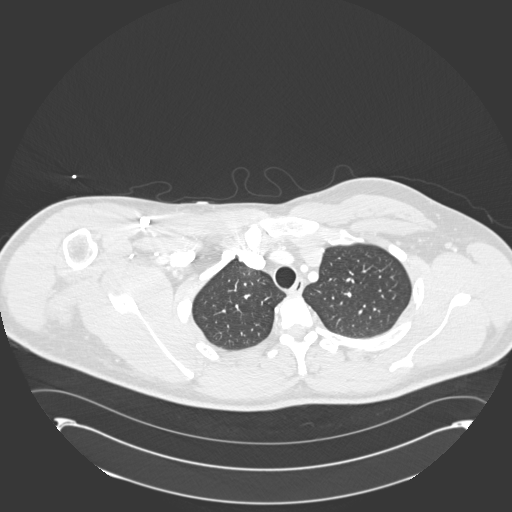
[im 587/699  soft-tissue]
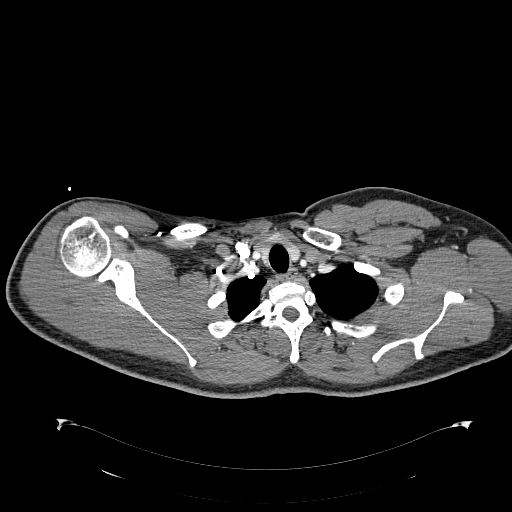
[im 615/699  lung]
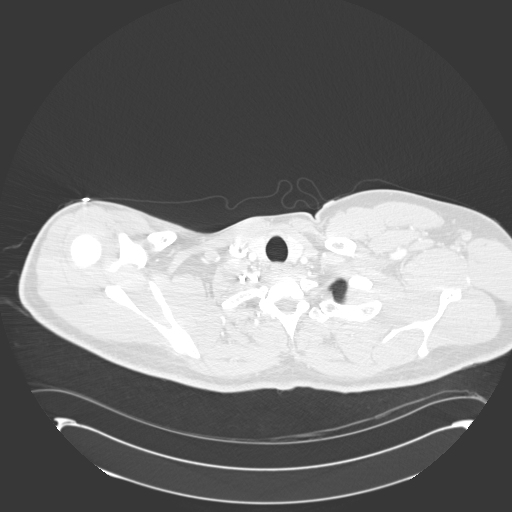
[im 671/699  soft-tissue]
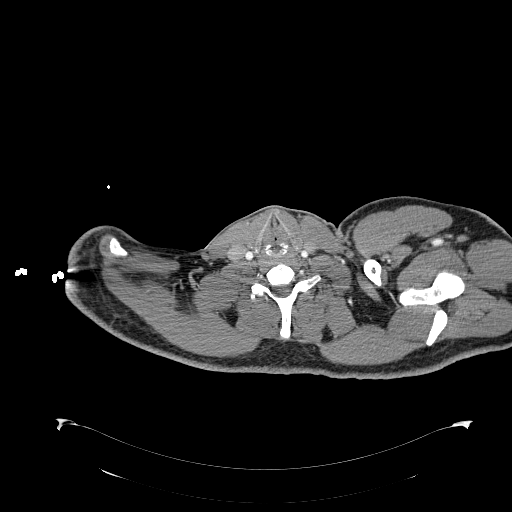

[Series 602: cor · coronal · 0.93mm/px · 3 of 131 slices shown]
[im 33/131  soft-tissue]
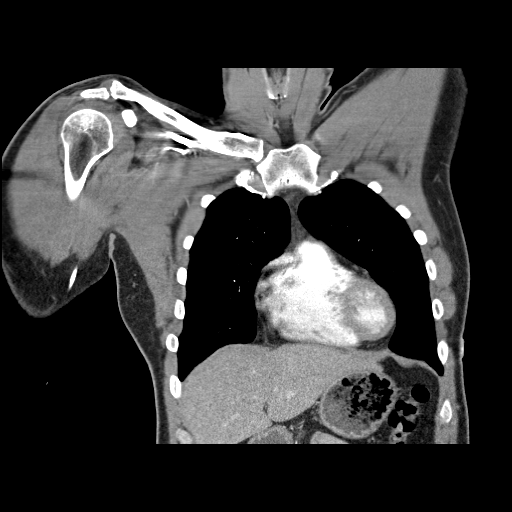
[im 66/131  soft-tissue]
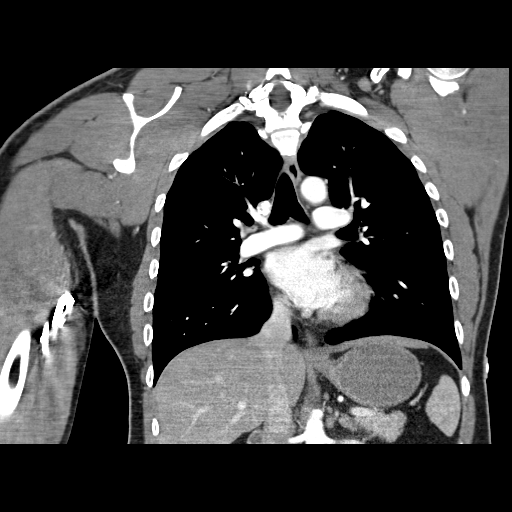
[im 98/131  soft-tissue]
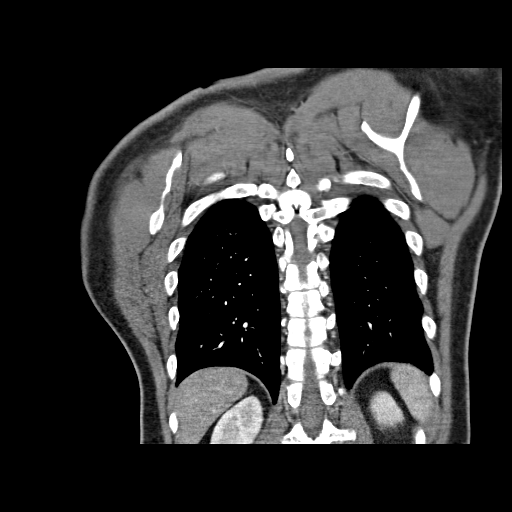

[19 of 46 positions shown; findings below may reference images not displayed]

FINDINGS: The subclavian vein narrows as it courses between the
first rib and clavicular head.  No intravascular filling defect
identified.  There are prominent collateral vessels which extends
inferiorly into the right upper extremity.

Normal caliber aorta and great vessels.  The main pulmonary
arteries are patent.  Heart size within normal limits.  No pleural
or pericardial effusion.  No intrathoracic lymphadenopathy.

Limited images through the upper abdomen show no acute abnormality.

Central airways are patent.  Lungs are clear.  No pneumothorax.  No
acute osseous abnormality.  No cervical rib.

Review of the MIP images confirms the above findings.
IMPRESSION: The subclavian vein narrows as it courses between the first rib and
clavicular head however.  No intravascular filling defect or
osseous anomaly identified.

## 2013-05-20 IMAGING — US IR [PERSON_NAME]/EXT/UNI*R*
1 series · 1 of 1 positions shown · non-contrast
Comparison: none

CLINICAL DATA: Asymmetric right arm swelling concerning for venous
thoracic outlet syndrome

[Series 1: ir (person_name)/ext/uni*right* · 1 of 1 slices shown]
[im 1/1]
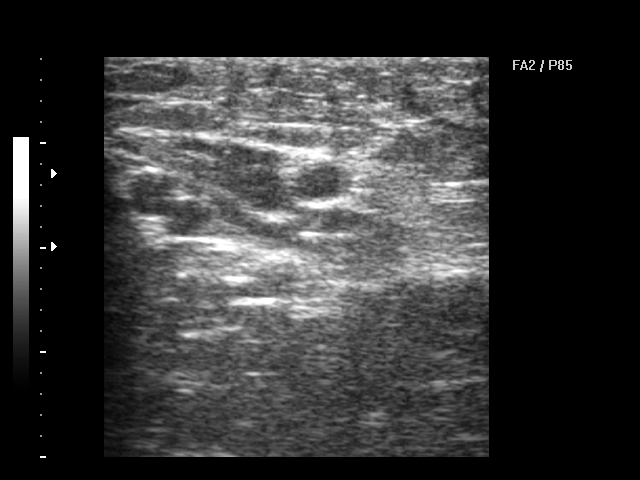

[1 of 1 positions shown; findings below may reference images not displayed]

ULTRASOUND GUIDANCE VASCULAR ACCESS
RIGHT UPPER EXTREMITY VENOGRAM WITH PROVOCATIVE MANEUVERS

Date:  01/03/2011 [DATE]

Radiologist:  Ferienhaus Erxleben, M.D.

Medications:  1% lidocaine locally

Guidance:  Ultrasound and fluoroscopic

Fluoroscopy time:  1 minute

Sedation time:  None.

Contrast volume:  70 ml [id number]-XXX

Complications:  No immediate

PROCEDURE/FINDINGS:

Informed consent was obtained from the patient following
explanation of the procedure, risks, benefits and alternatives.
The patient understands, agrees and consents for the procedure.
All questions were addressed.  A time out was performed.

Maximal barrier sterile technique utilized including caps, mask,
sterile gowns, sterile gloves, large sterile drape, hand hygiene,
and Chloroprep

Under sterile conditions and local anesthesia, right brachial
venous access was performed above the elbow.  Guide wire advanced
easily followed by the 4-French dilator.  Outer dilator remains in
place for contrast injection to perform a right upper extremity
venogram with provocative maneuvers.

Right upper extremity venogram:  Right brachial, axillary,
subclavian and innominate veins are patent.  SVC patent.  Central
venograms were performed in the neutral, 90 degree, and full
abduction positions.  With these provocative maneuvers, the right
subclavian and innominate veins remain patent with good central
flow.  No evidence of venous thoracic outlet syndrome demonstrated.
IMPRESSION: Normal right upper extremity venogram including provocative
maneuvers to evaluate for venous thoracic outlet syndrome.

## 2013-07-09 ENCOUNTER — Encounter: Payer: Self-pay | Admitting: Medical

## 2013-07-09 ENCOUNTER — Ambulatory Visit (INDEPENDENT_AMBULATORY_CARE_PROVIDER_SITE_OTHER): Payer: Commercial Managed Care - PPO | Admitting: Medical

## 2013-07-09 VITALS — BP 120/90 | HR 72 | Temp 98.0°F | Resp 14 | Ht 75.5 in | Wt 231.0 lb

## 2013-07-09 DIAGNOSIS — E785 Hyperlipidemia, unspecified: Secondary | ICD-10-CM

## 2013-07-09 DIAGNOSIS — Z0000 Encounter for general adult medical examination without abnormal findings: Secondary | ICD-10-CM

## 2013-07-09 DIAGNOSIS — R030 Elevated blood-pressure reading, without diagnosis of hypertension: Secondary | ICD-10-CM

## 2013-07-09 LAB — BASIC METABOLIC PANEL
BUN: 14 mg/dL (ref 6–23)
CO2: 24 mEq/L (ref 19–32)
CREATININE: 0.9 mg/dL (ref 0.50–1.35)
Calcium: 9.3 mg/dL (ref 8.4–10.5)
Chloride: 106 mEq/L (ref 96–112)
GLUCOSE: 88 mg/dL (ref 70–99)
Potassium: 4 mEq/L (ref 3.5–5.3)
SODIUM: 137 meq/L (ref 135–145)

## 2013-07-09 LAB — LIPID PANEL
CHOLESTEROL: 213 mg/dL — AB (ref 0–200)
HDL: 39 mg/dL — AB (ref >39)
LDL Cholesterol: 146 mg/dL — ABNORMAL HIGH (ref 0–99)
TRIGLYCERIDES: 140 mg/dL (ref <150)
Total CHOL/HDL Ratio: 5.5 Ratio
VLDL: 28 mg/dL (ref 0–40)

## 2013-07-09 LAB — CBC
HEMATOCRIT: 41.9 % (ref 39.0–52.0)
Hemoglobin: 14.9 g/dL (ref 13.0–17.0)
MCH: 29.7 pg (ref 26.0–34.0)
MCHC: 35.6 g/dL (ref 30.0–36.0)
MCV: 83.6 fL (ref 78.0–100.0)
Platelets: 189 10*3/uL (ref 150–400)
RBC: 5.01 MIL/uL (ref 4.22–5.81)
RDW: 14.7 % (ref 11.5–15.5)
WBC: 3 10*3/uL — ABNORMAL LOW (ref 4.0–10.5)
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
## 2013-07-09 NOTE — Patient Instructions (Signed)
Thank you for giving me the opportunity to serve you today.    Your diagnosis today includes: Encounter Diagnoses  Name Primary?  . Routine general medical examination at a health care facility Yes  . Elevated blood-pressure reading without diagnosis of hypertension   . Hyperlipidemia      Specific recommendations today include:  Try and lose 10 pounds in the next few months to improve blood pressure  Limit red meat, sweets and high cholesterol foods  Try and get exercise regularly  Goal blood pressure is 120/80.  Follow up pending labs.   I have included other useful information below for your review.  Hypertriglyceridemia  Diet for High blood levels of Triglycerides Most fats in food are triglycerides. Triglycerides in your blood are stored as fat in your body. High levels of triglycerides in your blood may put you at a greater risk for heart disease and stroke.  Normal triglyceride levels are less than 150 mg/dL. Borderline high levels are 150-199 mg/dl. High levels are 200 - 499 mg/dL, and very high triglyceride levels are greater than 500 mg/dL. The decision to treat high triglycerides is generally based on the level. For people with borderline or high triglyceride levels, treatment includes weight loss and exercise. Drugs are recommended for people with very high triglyceride levels. Many people who need treatment for high triglyceride levels have metabolic syndrome. This syndrome is a collection of disorders that often include: insulin resistance, high blood pressure, blood clotting problems, high cholesterol and triglycerides. TESTING PROCEDURE FOR TRIGLYCERIDES  You should not eat 4 hours before getting your triglycerides measured. The normal range of triglycerides is between 10 and 250 milligrams per deciliter (mg/dl). Some people may have extreme levels (1000 or above), but your triglyceride level may be too high if it is above 150 mg/dl, depending on what other risk  factors you have for heart disease.  People with high blood triglycerides may also have high blood cholesterol levels. If you have high blood cholesterol as well as high blood triglycerides, your risk for heart disease is probably greater than if you only had high triglycerides. High blood cholesterol is one of the main risk factors for heart disease. CHANGING YOUR DIET  Your weight can affect your blood triglyceride level. If you are more than 20% above your ideal body weight, you may be able to lower your blood triglycerides by losing weight. Eating less and exercising regularly is the best way to combat this. Fat provides more calories than any other food. The best way to lose weight is to eat less fat. Only 30% of your total calories should come from fat. Less than 7% of your diet should come from saturated fat. A diet low in fat and saturated fat is the same as a diet to decrease blood cholesterol. By eating a diet lower in fat, you may lose weight, lower your blood cholesterol, and lower your blood triglyceride level.  Eating a diet low in fat, especially saturated fat, may also help you lower your blood triglyceride level. Ask your dietitian to help you figure how much fat you can eat based on the number of calories your caregiver has prescribed for you.  Exercise, in addition to helping with weight loss may also help lower triglyceride levels.   Alcohol can increase blood triglycerides. You may need to stop drinking alcoholic beverages.  Too much carbohydrate in your diet may also increase your blood triglycerides. Some complex carbohydrates are necessary in your diet. These may include  bread, rice, potatoes, other starchy vegetables and cereals.  Reduce "simple" carbohydrates. These may include pure sugars, candy, honey, and jelly without losing other nutrients. If you have the kind of high blood triglycerides that is affected by the amount of carbohydrates in your diet, you will need to eat  less sugar and less high-sugar foods. Your caregiver can help you with this.  Adding 2-4 grams of fish oil (EPA+ DHA) may also help lower triglycerides. Speak with your caregiver before adding any supplements to your regimen. Following the Diet  Maintain your ideal weight. Your caregivers can help you with a diet. Generally, eating less food and getting more exercise will help you lose weight. Joining a weight control group may also help. Ask your caregivers for a good weight control group in your area.  Eat low-fat foods instead of high-fat foods. This can help you lose weight too.  These foods are lower in fat. Eat MORE of these:   Dried beans, peas, and lentils.  Egg whites.  Low-fat cottage cheese.  Fish.  Lean cuts of meat, such as round, sirloin, rump, and flank (cut extra fat off meat you fix).  Whole grain breads, cereals and pasta.  Skim and nonfat dry milk.  Low-fat yogurt.  Poultry without the skin.  Cheese made with skim or part-skim milk, such as mozzarella, parmesan, farmers', ricotta, or pot cheese. These are higher fat foods. Eat LESS of these:   Whole milk and foods made from whole milk, such as American, blue, cheddar, monterey jack, and swiss cheese  High-fat meats, such as luncheon meats, sausages, knockwurst, bratwurst, hot dogs, ribs, corned beef, ground pork, and regular ground beef.  Fried foods. Limit saturated fats in your diet. Substituting unsaturated fat for saturated fat may decrease your blood triglyceride level. You will need to read package labels to know which products contain saturated fats.  These foods are high in saturated fat. Eat LESS of these:   Fried pork skins.  Whole milk.  Skin and fat from poultry.  Palm oil.  Butter.  Shortening.  Cream cheese.  Tomasa BlaseBacon.  Margarines and baked goods made from listed oils.  Vegetable shortenings.  Chitterlings.  Fat from meats.  Coconut oil.  Palm kernel  oil.  Lard.  Cream.  Sour cream.  Fatback.  Coffee whiteners and non-dairy creamers made with these oils.  Cheese made from whole milk. Use unsaturated fats (both polyunsaturated and monounsaturated) moderately. Remember, even though unsaturated fats are better than saturated fats; you still want a diet low in total fat.  These foods are high in unsaturated fat:   Canola oil.  Sunflower oil.  Mayonnaise.  Almonds.  Peanuts.  Pine nuts.  Margarines made with these oils.  Safflower oil.  Olive oil.  Avocados.  Cashews.  Peanut butter.  Sunflower seeds.  Soybean oil.  Peanut oil.  Olives.  Pecans.  Walnuts.  Pumpkin seeds. Avoid sugar and other high-sugar foods. This will decrease carbohydrates without decreasing other nutrients. Sugar in your food goes rapidly to your blood. When there is excess sugar in your blood, your liver may use it to make more triglycerides. Sugar also contains calories without other important nutrients.  Eat LESS of these:   Sugar, brown sugar, powdered sugar, jam, jelly, preserves, honey, syrup, molasses, pies, candy, cakes, cookies, frosting, pastries, colas, soft drinks, punches, fruit drinks, and regular gelatin.  Avoid alcohol. Alcohol, even more than sugar, may increase blood triglycerides. In addition, alcohol is high in calories and  low in nutrients. Ask for sparkling water, or a diet soft drink instead of an alcoholic beverage. Suggestions for planning and preparing meals   Bake, broil, grill or roast meats instead of frying.  Remove fat from meats and skin from poultry before cooking.  Add spices, herbs, lemon juice or vinegar to vegetables instead of salt, rich sauces or gravies.  Use a non-stick skillet without fat or use no-stick sprays.  Cool and refrigerate stews and broth. Then remove the hardened fat floating on the surface before serving.  Refrigerate meat drippings and skim off fat to make low-fat  gravies.  Serve more fish.  Use less butter, margarine and other high-fat spreads on bread or vegetables.  Use skim or reconstituted non-fat dry milk for cooking.  Cook with low-fat cheeses.  Substitute low-fat yogurt or cottage cheese for all or part of the sour cream in recipes for sauces, dips or congealed salads.  Use half yogurt/half mayonnaise in salad recipes.  Substitute evaporated skim milk for cream. Evaporated skim milk or reconstituted non-fat dry milk can be whipped and substituted for whipped cream in certain recipes.  Choose fresh fruits for dessert instead of high-fat foods such as pies or cakes. Fruits are naturally low in fat. When Dining Out   Order low-fat appetizers such as fruit or vegetable juice, pasta with vegetables or tomato sauce.  Select clear, rather than cream soups.  Ask that dressings and gravies be served on the side. Then use less of them.  Order foods that are baked, broiled, poached, steamed, stir-fried, or roasted.  Ask for margarine instead of butter, and use only a small amount.  Drink sparkling water, unsweetened tea or coffee, or diet soft drinks instead of alcohol or other sweet beverages. QUESTIONS AND ANSWERS ABOUT OTHER FATS IN THE BLOOD: SATURATED FAT, TRANS FAT, AND CHOLESTEROL What is trans fat? Trans fat is a type of fat that is formed when vegetable oil is hardened through a process called hydrogenation. This process helps makes foods more solid, gives them shape, and prolongs their shelf life. Trans fats are also called hydrogenated or partially hydrogenated oils.  What do saturated fat, trans fat, and cholesterol in foods have to do with heart disease? Saturated fat, trans fat, and cholesterol in the diet all raise the level of LDL "bad" cholesterol in the blood. The higher the LDL cholesterol, the greater the risk for coronary heart disease (CHD). Saturated fat and trans fat raise LDL similarly.  What foods contain saturated  fat, trans fat, and cholesterol? High amounts of saturated fat are found in animal products, such as fatty cuts of meat, chicken skin, and full-fat dairy products like butter, whole milk, cream, and cheese, and in tropical vegetable oils such as palm, palm kernel, and coconut oil. Trans fat is found in some of the same foods as saturated fat, such as vegetable shortening, some margarines (especially hard or stick margarine), crackers, cookies, baked goods, fried foods, salad dressings, and other processed foods made with partially hydrogenated vegetable oils. Small amounts of trans fat also occur naturally in some animal products, such as milk products, beef, and lamb. Foods high in cholesterol include liver, other organ meats, egg yolks, shrimp, and full-fat dairy products. How can I use the new food label to make heart-healthy food choices? Check the Nutrition Facts panel of the food label. Choose foods lower in saturated fat, trans fat, and cholesterol. For saturated fat and cholesterol, you can also use the Percent Daily Value (%DV):  5% DV or less is low, and 20% DV or more is high. (There is no %DV for trans fat.) Use the Nutrition Facts panel to choose foods low in saturated fat and cholesterol, and if the trans fat is not listed, read the ingredients and limit products that list shortening or hydrogenated or partially hydrogenated vegetable oil, which tend to be high in trans fat. POINTS TO REMEMBER:   Discuss your risk for heart disease with your caregivers, and take steps to reduce risk factors.  Change your diet. Choose foods that are low in saturated fat, trans fat, and cholesterol.  Add exercise to your daily routine if it is not already being done. Participate in physical activity of moderate intensity, like brisk walking, for at least 30 minutes on most, and preferably all days of the week. No time? Break the 30 minutes into three, 10-minute segments during the day.  Stop smoking. If you do  smoke, contact your caregiver to discuss ways in which they can help you quit.  Do not use street drugs.  Maintain a normal weight.  Maintain a healthy blood pressure.  Keep up with your blood work for checking the fats in your blood as directed by your caregiver. Document Released: 12/30/2003 Document Revised: 09/12/2011 Document Reviewed: 07/27/2008 [facility] Patient Information 2014 [place], [place].

## 2013-07-10 ENCOUNTER — Other Ambulatory Visit: Payer: Self-pay | Admitting: Medical

## 2013-07-10 MED ORDER — ATORVASTATIN CALCIUM 40 MG PO TABS
40.0000 mg | ORAL_TABLET | Freq: Every day | ORAL | Status: DC
Start: 2013-07-10 — End: 2015-01-04

## 2013-07-14 ENCOUNTER — Ambulatory Visit (INDEPENDENT_AMBULATORY_CARE_PROVIDER_SITE_OTHER): Payer: Commercial Managed Care - PPO | Admitting: Family Medicine

## 2013-07-14 ENCOUNTER — Encounter: Payer: Self-pay | Admitting: Family Medicine

## 2013-07-14 VITALS — BP 110/80 | HR 66 | Wt 230.0 lb

## 2013-07-14 DIAGNOSIS — R1032 Left lower quadrant pain: Secondary | ICD-10-CM

## 2013-07-14 NOTE — Progress Notes (Deleted)
  Later on the pain started.   Subjective:    Patient ID: Troy Lang, male    DOB: 06/09/1975, 38 y.o.   MRN: [id number]  HPI  [name] is a very pleasant 38 y.o. yo male who has PMH significant for sleep disorders who presents today for acute onset side/abdominal pain.   The patient's pain is in the top half of the lower left quadrant and feels like a sharp stabbing pain. The pain began on the 15th, 5 days ago, following a general physical here in the office. The pain doesn't move or radiate and is always located in the same area. The pain is made worse with movement but does not wake the patient from sleep or present him from falling asleep. The patient has never had pain like this previously and denies any previous history of abdominal pain or surgery. The patient has not tried any medications to help relieve the pain. The patient denies any nausea, vomiting, diarrhea, change in stool caliber or color, headache, dysuria, hematuria, frequency or urgency.   Review of Systems is negative except per HPI.      Objective:   Physical Exam  Abdominal Exam: Normo-reactive bowel sounds. No rebound or guarding. No hepatosplenomegaly. Point tenderness present in the upper portion of the LLQ, at the level of the illiac crest. No bulges or pain illicited with palpation with flexing of the rectus abdominus.      Assessment & Plan:   Abdominal pain, left lower quadrant  Exam is very reassuring, given this in the context of a lack of any other symptoms, this pain is most likely musculoskeletal. If the symptoms continue or get worse please come back for further evaluation in 7-10 days. The patient was instructed to take OTC if necessary for relief of the pain.

## 2013-07-14 NOTE — Progress Notes (Signed)
  Later on the pain started.   Subjective:    Patient ID: Troy Lang, male    DOB: 03/10/1976, 38 y.o.   MRN: [id number]  HPI  [name] is a very pleasant 38 y.o. yo male who has PMH significant for sleep disorders who presents today for acute onset side/abdominal pain.   The patient's pain is in the top half of the lower left quadrant and feels like a sharp stabbing pain. The pain began on the 15th, 5 days ago, following a general physical here in the office. The pain doesn't move or radiate and is always located in the same area. The pain is made worse with movement but does not wake the patient from sleep or present him from falling asleep. The patient has never had pain like this previously and denies any previous history of abdominal pain or surgery. The patient has not tried any medications to help relieve the pain. The patient denies any nausea, vomiting, diarrhea, change in stool caliber or color, headache, dysuria, hematuria, frequency or urgency.   Review of Systems is negative except per HPI.      Objective:   Physical Exam  Abdominal Exam: Normo-reactive bowel sounds. No rebound or guarding. No hepatosplenomegaly. Point tenderness present in the upper portion of the LLQ, at the level of the illiac crest at the edge of the rectus muscle. No bulges or pain illicited with palpation with flexing of the rectus abdominus.      Assessment & Plan:   Abdominal pain, left lower quadrant  Exam is very reassuring, given this in the context of a lack of any other symptoms, this pain is most likely musculoskeletal. If the symptoms continue or get worse please come back for further evaluation in 7-10 days. The patient was instructed to take OTC if necessary for relief of the pain.

## 2013-07-14 NOTE — Progress Notes (Deleted)
   Subjective:    Patient ID: Troy Lang, male    DOB: 05/04/1975, 38 y.o.   MRN: [id number]  HPI    Review of Systems     Objective:   Physical Exam        Assessment & Plan:

## 2015-01-04 ENCOUNTER — Ambulatory Visit (INDEPENDENT_AMBULATORY_CARE_PROVIDER_SITE_OTHER): Payer: BLUE CROSS/BLUE SHIELD | Admitting: Medical

## 2015-01-04 ENCOUNTER — Encounter: Payer: Self-pay | Admitting: Medical

## 2015-01-04 VITALS — BP 126/72 | Temp 97.7°F | Ht 75.0 in | Wt 216.0 lb

## 2015-01-04 DIAGNOSIS — Z0000 Encounter for general adult medical examination without abnormal findings: Secondary | ICD-10-CM

## 2015-01-04 DIAGNOSIS — Z8639 Personal history of other endocrine, nutritional and metabolic disease: Secondary | ICD-10-CM | POA: Diagnosis not present

## 2015-01-04 DIAGNOSIS — Z23 Encounter for immunization: Secondary | ICD-10-CM

## 2015-01-04 DIAGNOSIS — Z125 Encounter for screening for malignant neoplasm of prostate: Secondary | ICD-10-CM | POA: Insufficient documentation

## 2015-01-04 LAB — HEMOGLOBIN A1C
Hgb A1c MFr Bld: 5.8 % — ABNORMAL HIGH (ref <5.7)
Mean Plasma Glucose: 120 mg/dL — ABNORMAL HIGH (ref <117)

## 2015-01-04 LAB — COMPREHENSIVE METABOLIC PANEL
ALBUMIN: 4.1 g/dL (ref 3.6–5.1)
ALK PHOS: 62 U/L (ref 40–115)
ALT: 12 U/L (ref 9–46)
AST: 13 U/L (ref 10–40)
BILIRUBIN TOTAL: 0.8 mg/dL (ref 0.2–1.2)
BUN: 12 mg/dL (ref 7–25)
CHLORIDE: 102 mmol/L (ref 98–110)
CO2: 25 mmol/L (ref 20–31)
CREATININE: 0.84 mg/dL (ref 0.60–1.35)
Calcium: 9.4 mg/dL (ref 8.6–10.3)
Glucose, Bld: 95 mg/dL (ref 65–99)
Potassium: 3.9 mmol/L (ref 3.5–5.3)
Sodium: 135 mmol/L (ref 135–146)
TOTAL PROTEIN: 6.5 g/dL (ref 6.1–8.1)

## 2015-01-04 LAB — CBC
HCT: 42.8 % (ref 39.0–52.0)
HEMOGLOBIN: 15 g/dL (ref 13.0–17.0)
MCH: 30.3 pg (ref 26.0–34.0)
MCHC: 35 g/dL (ref 30.0–36.0)
MCV: 86.5 fL (ref 78.0–100.0)
MPV: 9.8 fL (ref 8.6–12.4)
PLATELETS: 182 10*3/uL (ref 150–400)
RBC: 4.95 MIL/uL (ref 4.22–5.81)
RDW: 14.2 % (ref 11.5–15.5)
WBC: 3.2 10*3/uL — ABNORMAL LOW (ref 4.0–10.5)

## 2015-01-04 LAB — POCT URINALYSIS DIPSTICK
Bilirubin, UA: NEGATIVE
Blood, UA: NEGATIVE
Glucose, UA: NEGATIVE
Ketones, UA: NEGATIVE
Leukocytes, UA: NEGATIVE
NITRITE UA: NEGATIVE
PROTEIN UA: NEGATIVE
SPEC GRAV UA: 1.02
UROBILINOGEN UA: NEGATIVE
pH, UA: 6

## 2015-01-04 LAB — LIPID PANEL
CHOLESTEROL: 191 mg/dL (ref 125–200)
HDL: 46 mg/dL (ref >=40)
LDL Cholesterol: 127 mg/dL (ref <130)
Total CHOL/HDL Ratio: 4.2 Ratio (ref <=5.0)
Triglycerides: 89 mg/dL (ref <150)
VLDL: 18 mg/dL (ref <30)

## 2015-01-04 NOTE — Patient Instructions (Signed)
  Thank you for giving me the opportunity to serve you today.    Your diagnosis today includes: Encounter Diagnoses  Name Primary?  . Encounter for health maintenance examination in adult Yes  . Need for prophylactic vaccination and inoculation against influenza   . History of hyperlipidemia    Recommendations:  We updated your flu shot today  I recommend your whole household get vaccinated against influenza   See your dentist yearly for routine dental care including hygiene visits twice yearly.  We will call with lab results  Your blood pressure is normal today  Its good to see your weight improved.  Try and get exercise most days of the week
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
## 2015-01-04 NOTE — Addendum Note (Signed)
Addended by: Kieth Brightly on: 01/04/2015 10:15 AM   Modules accepted: Orders

## 2015-10-01 ENCOUNTER — Encounter: Payer: BLUE CROSS/BLUE SHIELD | Admitting: Medical

## 2016-05-23 ENCOUNTER — Encounter: Payer: Self-pay | Admitting: Medical

## 2016-05-23 ENCOUNTER — Ambulatory Visit (INDEPENDENT_AMBULATORY_CARE_PROVIDER_SITE_OTHER): Payer: Self-pay | Admitting: Medical

## 2016-05-23 VITALS — BP 128/80 | HR 69 | Ht 75.5 in | Wt 226.0 lb

## 2016-05-23 DIAGNOSIS — Z0289 Encounter for other administrative examinations: Secondary | ICD-10-CM

## 2016-05-23 LAB — POCT URINALYSIS DIPSTICK
Bilirubin, UA: NEGATIVE
Blood, UA: NEGATIVE
GLUCOSE UA: NEGATIVE
Ketones, UA: NEGATIVE
Leukocytes, UA: NEGATIVE
Nitrite, UA: NEGATIVE
Protein, UA: NEGATIVE
UROBILINOGEN UA: NEGATIVE
pH, UA: 6

## 2016-05-23 NOTE — Addendum Note (Signed)
Addended by: Winn JockVALENTINE, Tiyonna Sardinha N on: 05/23/2016 09:57 AM   Modules accepted: Orders

## 2016-05-23 NOTE — Progress Notes (Signed)
Airline pilotCommercial Driver Medical Examination   Troy MaoMouhamadal Branch is a 41 y.o. male who presents today for a commercial driver fitness determination physical exam.  [place] [employer] [place].  Drives local.   Last DOT physical 2 years ago.  Medical care team includes: Troy CoveyShane Aydien Majette PA-C here for primary care  The patient reports no problems.  Review of Systems A comprehensive review of systems was reviewed and noted as below:  Eye: - corrective lenses, -lasik surgery or other eye surgery, -glaucoma, -cataracts, -macular degeneration, -monocular vision, -medication for eye condition, -blurred vision,   Ears: -hearing problems, - hearing aids, -ear pain, -ear drainage, -ear fullness, -tinnitus, -recurrent ear infection, -previous ear surgery, - vertigo, -meniere's disease  Endocrine: -polydipsia, -polyuria, -weight loss, -fainting, -dizziness, - altered or loss of consciousness, -hypoglycemia  Cardiovascular: -heart disease, -CHF, -heart attack, -cardiac stents, -bypass surgery, -other heart surgery, -hypertension, -blood clots, -pacemaker, -medications for heart condition, -chest pain, -SOB, -palpitations, -fainting, -dizziness, -dyspnea  Respiratory: -asthma, -COPD, other lung disease, -smoker, -chest tightness, - wheezing, -snoring, -daytime sleepiness, -sleep apnea or uses CPAP, -narcolepsy, -sleeping disorder  Allergy: -uncontrollable sneezing or allergy symptoms  Musculoskeletal: -missing body parts, -muscle disease, -bone disease, -spine injury, -low back pain, -medication for joints, bones, muscles or pain, -physical limitations, -joint pain, -neck pain, -limitations of neck ROM, -back surgery, orthopedic surgery, -rheumatologic condition, -gout  Neurologic: -neurologic disease, -dementia, -seizures, -parkinson's, -tremor, -memory problems, -weakness, -numbness, -tingling, -medication for neurologic condition, -medications for sleep condition  Gastric: -abdominal pain,  -chronic diarrhea or IBS, -uncontrollable nausea  Kidney/Renal: -hematuria, -dialysis, kidney disease, polycystic kidney disease  Psychiatric: -homicidal thoughts, -suicidal thoughts, -prior suicide attempts, -get into fights/hurting others, -memory or concentration problems, -delusions, -hallucinations, -hospitalization for mental health problem, -depression, -anxiety, -bipolar  Drug use: - none  Reviewed their medical, surgical, family, social, medication, and allergy history and updated chart as appropriate.       Objective:   Physical Exam  BP 128/80   Pulse 69   Ht 6' 3.5" (1.918 m)   Wt 226 lb (102.5 kg)   SpO2 97%   BMI 27.88 kg/m   General appearance: alert, no distress, WD/WN, AA male Skin: flat large birth mark right lateral upper arm, no other worrisome lesions HEENT: normocephalic, conjunctiva/corneas normal, sclerae anicteric, PERRLA, EOMi, nares patent, no discharge or erythema, pharynx normal Oral cavity: MMM, tongue normal, teeth normal Neck: supple, no lymphadenopathy, no thyromegaly, no masses, normal ROM, no bruits Chest: non tender, normal shape and expansion Heart: RRR, normal S1, S2, no murmurs Lungs: CTA bilaterally, no wheezes, rhonchi, or rales Abdomen: +bs, soft, non tender, non distended, no masses, no hepatomegaly, no splenomegaly, no bruits Back: non tender, normal ROM, no scoliosis Musculoskeletal: upper extremities non tender, no obvious deformity, normal ROM throughout, lower extremities non tender, no obvious deformity, normal ROM throughout Extremities: no edema, no cyanosis, no clubbing Pulses: 2+ symmetric, upper and lower extremities, normal cap refill Neurological: alert, oriented x 3, CN2-12 intact, strength normal upper extremities and lower extremities, sensation normal throughout, DTRs 2+ throughout, no cerebellar signs, gait normal Psychiatric: normal affect, behavior normal, pleasant  GU: normal male external genitalia, nontender, no  masses, no hernia, no lymphadenopathy Rectal: deferred   Assessment:   Encounter Diagnosis  Name Primary?  . Health examination of defined subpopulation Yes     Plan:    Certified for 2 year certificate.  Return soon for routine wellness physical and fasting labs

## 2016-05-25 ENCOUNTER — Telehealth: Payer: Self-pay | Admitting: Medical

## 2016-05-25 NOTE — Telephone Encounter (Signed)
I saw them recently for a DOT physical.  Please let them know that for a few months now the Federal Website is not functional to file the DOT with them.  The [employer] has instructed us to tell patients to send a copy of their "Medical Examiners Certificate" the their state dept of motor vehicles. Please remind them to do this if not done already.    We will file their info with the Federal website when we are notified it is up and running.  This is an inconvenience to the patient and us, but we are waiting on them at this point.   FYI - if needed, send a COPY of their one page " Medical Examiner's Certificate" to the contact info the patient gives you for the [place] [employer].  Thanks.  Shane 

## 2016-05-26 NOTE — Telephone Encounter (Signed)
Pt informed

## 2017-05-04 ENCOUNTER — Encounter: Payer: BLUE CROSS/BLUE SHIELD | Admitting: Medical

## 2017-07-05 ENCOUNTER — Ambulatory Visit: Payer: BLUE CROSS/BLUE SHIELD | Admitting: Medical

## 2017-07-05 ENCOUNTER — Encounter: Payer: Self-pay | Admitting: Medical

## 2017-07-05 VITALS — BP 124/82 | HR 99 | Ht 74.5 in | Wt 221.6 lb

## 2017-07-05 DIAGNOSIS — Z7189 Other specified counseling: Secondary | ICD-10-CM | POA: Diagnosis not present

## 2017-07-05 DIAGNOSIS — Z125 Encounter for screening for malignant neoplasm of prostate: Secondary | ICD-10-CM

## 2017-07-05 DIAGNOSIS — Z0000 Encounter for general adult medical examination without abnormal findings: Secondary | ICD-10-CM

## 2017-07-05 DIAGNOSIS — M25562 Pain in left knee: Secondary | ICD-10-CM

## 2017-07-05 DIAGNOSIS — Z7185 Encounter for immunization safety counseling: Secondary | ICD-10-CM | POA: Insufficient documentation

## 2017-07-05 DIAGNOSIS — Z23 Encounter for immunization: Secondary | ICD-10-CM

## 2017-07-05 NOTE — Addendum Note (Signed)
Addended by: Darene LamerHOMPSON, Damyiah Moxley T on: 07/05/2017 09:48 AM   Modules accepted: Orders
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
## 2017-07-05 NOTE — Patient Instructions (Addendum)
Thanks for trusting us with your health care and for coming in for a physical today.  Below are some general recommendations I have for you:  Yearly screenings See your eye doctor yearly for routine vision care. See your dentist yearly for routine dental care including hygiene visits twice yearly. See me here yearly for a routine physical and preventative care visit   Specific Concerns today:  . Left knee pain - when you have pain, you can use leg elevation, ice water pack for 20 minutes twice daily, and over the counter Aleve for a few days at a time.   Do stretching daily . If you continue to have pains, we can get an xray . We updated your Tdap - tetanus diptheria and pertussis vaccine today.  It is good for 10 years . Check testicles monthly for lumps or bumps . We did a baseline prostate exam today   Eye doctors: Dr. Glenford PeersHoward McFarland [address], [place], [place] [address] [phone]   [facility] Dr. Gelene Minkimothy Koop [address], [address] [place], [place] [address]  [phone] [email or web address]   Vincenza HewsSigmund S. Gould, M.D. Susanne GreenhouseJason A. Gould, O.D. [address] [place], [place] [address] Medical telephone: [phone] Optical telephone: [phone]   Please follow up yearly for a physical.   Preventative Care for Adults - Male      MAINTAIN REGULAR HEALTH EXAMS:  A routine yearly physical is a good way to check in with your primary care provider about your health and preventive screening. It is also an opportunity to share updates about your health and any concerns you have, and receive a thorough all-over exam.   Most health insurance companies pay for at least some preventative services.  Check with your health plan for specific coverages.  WHAT PREVENTATIVE SERVICES DO WOMEN NEED?  Adult men should have their weight and blood pressure checked regularly.   Men age 42 and older should have their cholesterol levels checked  regularly.  Beginning at age 42 and continuing to age 42, men should be screened for colorectal cancer.  Certain people may need continued testing until age 42.  Updating vaccinations is part of preventative care.  Vaccinations help protect against diseases such as the flu.  Osteoporosis is a disease in which the bones lose minerals and strength as we age. Men ages 2865 and over should discuss this with their caregivers  Lab tests are generally done as part of preventative care to screen for anemia and blood disorders, to screen for problems with the kidneys and liver, to screen for bladder problems, to check blood sugar, and to check your cholesterol level.  Preventative services generally include counseling about diet, exercise, avoiding tobacco, drugs, excessive alcohol consumption, and sexually transmitted infections.    GENERAL RECOMMENDATIONS FOR GOOD HEALTH:  Healthy diet:  Eat a variety of foods, including fruit, vegetables, animal or vegetable protein, such as meat, fish, chicken, and eggs, or beans, lentils, tofu, and grains, such as rice.  Drink plenty of water daily.  Decrease saturated fat in the diet, avoid lots of red meat, processed foods, sweets, fast foods, and fried foods.  Exercise:  Aerobic exercise helps maintain good heart health. At least 30-40 minutes of moderate-intensity exercise is recommended. For example, a brisk walk that increases your heart rate and breathing. This should be done on most days of the week.   Find a type of exercise or a variety of exercises that you enjoy so  that it becomes a part of your daily life.  Examples are running, walking, swimming, water aerobics, and biking.  For motivation and support, explore group exercise such as aerobic class, spin class, Zumba, Yoga,or  martial arts, etc.    Set exercise goals for yourself, such as a certain weight goal, walk or run in a race such as a 5k walk/run.  Speak to your primary care provider about  exercise goals.  Disease prevention:  If you smoke or chew tobacco, find out from your caregiver how to quit. It can literally save your life, no matter how long you have been a tobacco user. If you do not use tobacco, never begin.   Maintain a healthy diet and normal weight. Increased weight leads to problems with blood pressure and diabetes.   The Body Mass Index or BMI is a way of measuring how much of your body is fat. Having a BMI above 27 increases the risk of heart disease, diabetes, hypertension, stroke and other problems related to obesity. Your caregiver can help determine your BMI and based on it develop an exercise and dietary program to help you achieve or maintain this important measurement at a healthful level.  High blood pressure causes heart and blood vessel problems.  Persistent high blood pressure should be treated with medicine if weight loss and exercise do not work.   Fat and cholesterol leaves deposits in your arteries that can block them. This causes heart disease and vessel disease elsewhere in your body.  If your cholesterol is found to be high, or if you have heart disease or certain other medical conditions, then you may need to have your cholesterol monitored frequently and be treated with medication.   Ask if you should have a cardiac stress test if your history suggests this. A stress test is a test done on a treadmill that looks for heart disease. This test can find disease prior to there being a problem.  Osteoporosis is a disease in which the bones lose minerals and strength as we age. This can result in serious bone fractures. Risk of osteoporosis can be identified using a bone density scan. Men ages 2 and over should discuss this with their caregivers. Ask your caregiver whether you should be taking a calcium supplement and Vitamin D, to reduce the rate of osteoporosis.   Avoid drinking alcohol in excess (more than two drinks per day).  Avoid use of street  drugs. Do not share needles with anyone. Ask for professional help if you need assistance or instructions on stopping the use of alcohol, cigarettes, and/or drugs.  Brush your teeth twice a day with fluoride toothpaste, and floss once a day. Good oral hygiene prevents tooth decay and gum disease. The problems can be painful, unattractive, and can cause other health problems. Visit your dentist for a routine oral and dental check up and preventive care every 6-12 months.   Look at your skin regularly.  Use a mirror to look at your back. Notify your caregivers of changes in moles, especially if there are changes in shapes, colors, a size larger than a pencil eraser, an irregular border, or development of new moles.  Safety:  Use seatbelts 100% of the time, whether driving or as a passenger.  Use safety devices such as hearing protection if you work in environments with loud noise or significant background noise.  Use safety glasses when doing any work that could send debris in to the eyes.  Use a helmet  if you ride a bike or motorcycle.  Use appropriate safety gear for contact sports.  Talk to your caregiver about gun safety.  Use sunscreen with a SPF (or skin protection factor) of 15 or greater.  Lighter skinned people are at a greater risk of skin cancer. Don't forget to also wear sunglasses in order to protect your eyes from too much damaging sunlight. Damaging sunlight can accelerate cataract formation.   Practice safe sex. Use condoms. Condoms are used for birth control and to help reduce the spread of sexually transmitted infections (or STIs).  Some of the STIs are gonorrhea (the clap), chlamydia, syphilis, trichomonas, herpes, HPV (human papilloma virus) and HIV (human immunodeficiency virus) which causes AIDS. The herpes, HIV and HPV are viral illnesses that have no cure. These can result in disability, cancer and death.   Keep carbon monoxide and smoke detectors in your home functioning at all  times. Change the batteries every 6 months or use a model that plugs into the wall.   Vaccinations:  Stay up to date with your tetanus shots and other required immunizations. You should have a booster for tetanus every 10 years. Be sure to get your flu shot every year, since 5%-20% of the [place] population comes down with the flu. The flu vaccine changes each year, so being vaccinated once is not enough. Get your shot in the fall, before the flu season peaks.   Other vaccines to consider:  Human Papilloma Virus or HPV causes cancer of the cervix, and other infections that can be transmitted from person to person. There is a vaccine for HPV, and males should get immunized between the ages of 86 and 46. It requires a series of 3 shots.   Pneumococcal vaccine to protect against certain types of pneumonia.  This is normally recommended for adults age 34 or older.  However, adults younger than 42 years old with certain underlying conditions such as diabetes, heart or lung disease should also receive the vaccine.  Shingles vaccine to protect against Varicella Zoster if you are older than age 19, or younger than 42 years old with certain underlying illness.  If you have not had the Shingrix vaccine, please call your insurer to inquire about coverage for the Shingrix vaccine given in 2 doses.   Some insurers cover this vaccine after age 54, some cover this after age 24.  If your insurer covers this, then call to schedule appointment to have this vaccine here  Hepatitis A vaccine to protect against a form of infection of the liver by a virus acquired from food.  Hepatitis B vaccine to protect against a form of infection of the liver by a virus acquired from blood or body fluids, particularly if you work in health care.  If you plan to travel internationally, check with your local health department for specific vaccination recommendations.   What should I know about cancer screening? Many types of cancers  can be detected early and may often be prevented. Lung Cancer  You should be screened every year for lung cancer if: ? You are a current smoker who has smoked for at least 30 years. ? You are a former smoker who has quit within the past 15 years.  Talk to your health care provider about your screening options, when you should start screening, and how often you should be screened.  Colorectal Cancer  Routine colorectal cancer screening usually begins at 42 years of age and should be repeated every 5-10 years  until you are 42 years old. You may need to be screened more often if early forms of precancerous polyps or small growths are found. Your health care provider may recommend screening at an earlier age if you have risk factors for colon cancer.  Your health care provider may recommend using home test kits to check for hidden blood in the stool.  A small camera at the end of a tube can be used to examine your colon (sigmoidoscopy or colonoscopy). This checks for the earliest forms of colorectal cancer.  Prostate and Testicular Cancer  Depending on your age and overall health, your health care provider may do certain tests to screen for prostate and testicular cancer.  Talk to your health care provider about any symptoms or concerns you have about testicular or prostate cancer.  Skin Cancer  Check your skin from head to toe regularly.  Tell your health care provider about any new moles or changes in moles, especially if: ? There is a change in a mole's size, shape, or color. ? You have a mole that is larger than a pencil eraser.  Always use sunscreen. Apply sunscreen liberally and repeat throughout the day.  Protect yourself by wearing long sleeves, pants, a wide-brimmed hat, and sunglasses when outside.

## 2017-07-06 ENCOUNTER — Telehealth: Payer: Self-pay

## 2017-07-06 LAB — COMPREHENSIVE METABOLIC PANEL
ALBUMIN: 4.4 g/dL (ref 3.5–5.5)
ALK PHOS: 71 IU/L (ref 39–117)
ALT: 12 IU/L (ref 0–44)
AST: 13 IU/L (ref 0–40)
Albumin/Globulin Ratio: 1.6 (ref 1.2–2.2)
BUN / CREAT RATIO: 11 (ref 9–20)
BUN: 11 mg/dL (ref 6–24)
Bilirubin Total: 1.4 mg/dL — ABNORMAL HIGH (ref 0.0–1.2)
CALCIUM: 9.6 mg/dL (ref 8.7–10.2)
CO2: 21 mmol/L (ref 20–29)
CREATININE: 1.01 mg/dL (ref 0.76–1.27)
Chloride: 103 mmol/L (ref 96–106)
GFR, EST AFRICAN AMERICAN: 106 mL/min/{1.73_m2} (ref >59)
GFR, EST NON AFRICAN AMERICAN: 92 mL/min/{1.73_m2} (ref >59)
Globulin, Total: 2.8 g/dL (ref 1.5–4.5)
Glucose: 91 mg/dL (ref 65–99)
Potassium: 3.8 mmol/L (ref 3.5–5.2)
Sodium: 140 mmol/L (ref 134–144)
TOTAL PROTEIN: 7.2 g/dL (ref 6.0–8.5)

## 2017-07-06 LAB — CBC
HEMATOCRIT: 44.8 % (ref 37.5–51.0)
HEMOGLOBIN: 16 g/dL (ref 13.0–17.7)
MCH: 30.8 pg (ref 26.6–33.0)
MCHC: 35.7 g/dL (ref 31.5–35.7)
MCV: 86 fL (ref 79–97)
Platelets: 187 10*3/uL (ref 150–379)
RBC: 5.19 x10E6/uL (ref 4.14–5.80)
RDW: 14.7 % (ref 12.3–15.4)
WBC: 2.7 10*3/uL — ABNORMAL LOW (ref 3.4–10.8)

## 2017-07-06 LAB — LIPID PANEL
CHOL/HDL RATIO: 4.7 ratio (ref 0.0–5.0)
Cholesterol, Total: 237 mg/dL — ABNORMAL HIGH (ref 100–199)
HDL: 50 mg/dL (ref >39)
LDL CALC: 163 mg/dL — AB (ref 0–99)
TRIGLYCERIDES: 118 mg/dL (ref 0–149)
VLDL Cholesterol Cal: 24 mg/dL (ref 5–40)

## 2017-07-06 LAB — PSA: PROSTATE SPECIFIC AG, SERUM: 0.9 ng/mL (ref 0.0–4.0)

## 2017-07-06 LAB — HEMOGLOBIN A1C
ESTIMATED AVERAGE GLUCOSE: 114 mg/dL
HEMOGLOBIN A1C: 5.6 % (ref 4.8–5.6)

## 2017-07-06 NOTE — Telephone Encounter (Signed)
Called patient and gave lab results. Patient had no questions or concerns.  

## 2018-11-18 ENCOUNTER — Other Ambulatory Visit: Payer: Self-pay

## 2018-11-18 DIAGNOSIS — Z20822 Contact with and (suspected) exposure to covid-19: Secondary | ICD-10-CM

## 2018-11-19 LAB — NOVEL CORONAVIRUS, NAA: SARS-CoV-2, NAA: NOT DETECTED

## 2019-02-10 ENCOUNTER — Encounter: Payer: Self-pay | Admitting: Medical

## 2019-02-10 ENCOUNTER — Ambulatory Visit: Payer: BC Managed Care – PPO | Admitting: Medical

## 2019-02-10 ENCOUNTER — Other Ambulatory Visit: Payer: Self-pay

## 2019-02-10 VITALS — BP 132/86 | HR 72 | Temp 97.0°F | Ht 75.0 in | Wt 232.6 lb

## 2019-02-10 DIAGNOSIS — R7989 Other specified abnormal findings of blood chemistry: Secondary | ICD-10-CM

## 2019-02-10 DIAGNOSIS — Z6829 Body mass index (BMI) 29.0-29.9, adult: Secondary | ICD-10-CM | POA: Diagnosis not present

## 2019-02-10 DIAGNOSIS — D72819 Decreased white blood cell count, unspecified: Secondary | ICD-10-CM | POA: Diagnosis not present

## 2019-02-10 DIAGNOSIS — Z23 Encounter for immunization: Secondary | ICD-10-CM | POA: Diagnosis not present

## 2019-02-10 DIAGNOSIS — Z0000 Encounter for general adult medical examination without abnormal findings: Secondary | ICD-10-CM | POA: Diagnosis not present

## 2019-02-10 NOTE — Progress Notes (Signed)
Subjective:   HPI  Troy Lang is a 43 y.o. male who presents for Chief Complaint  Patient presents with  . Annual Exam    with fasting labs    Medical Team Sees dentist Sees eye doctor Dr. Viann Fish 2012 consult Dr. Venita Lick, orthopedics   Concerns: None, feeling healthy  Kids and wife liver back in [place], they moved back 2 years ago.   He has been visiting periodically.  just got back from there 1 month ago.  Was there for 2 months.   Reviewed their medical, surgical, family, social, medication, and allergy history and updated chart as appropriate.  Past Medical History:  Diagnosis Date  . Abnormal EKG 12/2010   cardiac eval Dr. Viann Fish  . Abnormal EKG 12/2010   echo and EKG done to eval T wave inversions for preop;  Dr. Viann Fish  . Cervical disc disease 2012   s/p discectomy, Dr. Shon Baton  . Edema of upper extremity 11/2010   hospitalization for edema  . Elevated blood-pressure reading without diagnosis of hypertension 2013   improved with weight loss  . Former smoker   . Hyperlipidemia     Past Surgical History:  Procedure Laterality Date  . CERVICAL DISCECTOMY  2012   Dr. Venita Lick    Social History   Socioeconomic History  . Marital status: Married    Spouse name: Not on file  . Number of children: Not on file  . Years of education: Not on file  . Highest education level: Not on file  Occupational History  . Occupation: Occupational psychologist: [employer]  Social Needs  . Financial resource strain: Not on file  . Food insecurity    Worry: Not on file    Inability: Not on file  . Transportation needs    Medical: Not on file    Non-medical: Not on file  Tobacco Use  . Smoking status: Former Smoker    Packs/day: 0.50    Years: 10.00    Pack years: 5.00    Quit date: 09/23/2010    Years since quitting: 8.3  . Smokeless tobacco: Never Used  . Tobacco comment: pt states that he has smoke one or 2  after that  Substance and Sexual Activity  . Alcohol use: No  . Drug use: No  . Sexual activity: Not on file  Lifestyle  . Physical activity    Days per week: Not on file    Minutes per session: Not on file  . Stress: Not on file  Relationships  . Social Musician on phone: Not on file    Gets together: Not on file    Attends religious service: Not on file    Active member of club or organization: Not on file    Attends meetings of clubs or organizations: Not on file    Relationship status: Not on file  . Intimate partner violence    Fear of current or ex partner: Not on file    Emotionally abused: Not on file    Physically abused: Not on file    Forced sexual activity: Not on file  Other Topics Concern  . Not on file  Social History Narrative   Married, 3 children.   Some exercise, is a Naval architect.   Muslim, prior work as Production designer, theatre/television/film.  Originally from [place].  Exercise 2 + days per week, biking.  01/2019    Family History  Problem Relation Age of Onset  . Diabetes Mother   . Hypertension Mother   . Diabetes Father   . Hypertension Father   . Diabetes Brother   . Stroke Brother   . Diabetes Brother   . Cancer Neg Hx   . COPD Neg Hx   . Heart disease Neg Hx     No current outpatient medications on file.  No Known Allergies    Review of Systems Constitutional: -fever, -chills, -sweats, -unexpected weight change, -decreased appetite, -fatigue Allergy: -sneezing, -itching, -congestion Dermatology: -changing moles, --rash, -lumps ENT: -runny nose, -ear pain, -sore throat, -hoarseness, -sinus pain, -teeth pain, - ringing in ears, -hearing loss, -nosebleeds Cardiology: -chest pain, -palpitations, -swelling, -difficulty breathing when lying flat, -waking up short of breath Respiratory: -cough, -shortness of breath, -difficulty breathing with exercise or exertion, -wheezing, -coughing up blood Gastroenterology: -abdominal pain, -nausea, -vomiting,  -diarrhea, -constipation, -blood in stool, -changes in bowel movement, -difficulty swallowing or eating Hematology: -bleeding, -bruising  Musculoskeletal: -joint aches, -muscle aches, -joint swelling, -back pain, -neck pain, -cramping, -changes in gait Ophthalmology: denies vision changes, eye redness, itching, discharge Urology: -burning with urination, -difficulty urinating, -blood in urine, -urinary frequency, -urgency, -incontinence Neurology: -headache, -weakness, -tingling, -numbness, -memory loss, -falls, -dizziness Psychology: -depressed mood, -agitation, -sleep problems Male GU: no testicular mass, pain, no lymph nodes swollen, no swelling, no rash.     Objective:  BP 132/86   Pulse 72   Temp (!) 97 F (36.1 C)   Ht 6\' 3"  (1.905 m)   Wt 232 lb 9.6 oz (105.5 kg)   SpO2 98%   BMI 29.07 kg/m   General appearance: alert, no distress, WD/WN, African American male Skin: unremarkable, no worrisome lesions HEENT: normocephalic, conjunctiva/corneas normal, sclerae anicteric, PERRLA, EOMi, nares patent, no discharge or erythema, pharynx normal Oral cavity: MMM, tongue normal, teeth normal Neck: supple, no lymphadenopathy, no thyromegaly, no masses, normal ROM, no bruits Chest: non tender, normal shape and expansion Heart: RRR, normal S1, S2, no murmurs Lungs: CTA bilaterally, no wheezes, rhonchi, or rales Abdomen: +bs, soft, non tender, non distended, no masses, no hepatomegaly, no splenomegaly, no bruits Back: non tender, normal ROM, no scoliosis Musculoskeletal: upper extremities non tender, no obvious deformity, normal ROM throughout, lower extremities non tender, no obvious deformity, normal ROM throughout Extremities: no edema, no cyanosis, no clubbing Pulses: 2+ symmetric, upper and lower extremities, normal cap refill Neurological: alert, oriented x 3, CN2-12 intact, strength normal upper extremities and lower extremities, sensation normal throughout, DTRs 2+ throughout, no  cerebellar signs, gait normal Psychiatric: normal affect, behavior normal, pleasant  GU: normal male external genitalia,circumcised, nontender, no masses, no hernia, no lymphadenopathy Rectal:deferred   Assessment and Plan :   Encounter Diagnoses  Name Primary?  . Encounter for health maintenance examination in adult Yes  . Need for influenza vaccination   . Leukopenia, unspecified type   . BMI 29.0-29.9,adult     Physical exam - discussed and counseled on healthy lifestyle, diet, exercise, preventative care, vaccinations, sick and well care, proper use of emergency dept and after hours care, and addressed their concerns.    Health screening: See your eye doctor yearly for routine vision care. See your dentist yearly for routine dental care including hygiene visits twice yearly.  Cancer screening Advised monthly self testicular exam  Colonoscopy:  Advised age 43yo  Discussed PSA, prostate exam, and prostate cancer screening risks/benefits.  PSA reviewed from 2019    Vaccinations: Advised yearly influenza vaccine Counseled on the  influenza virus vaccine.  Vaccine information sheet given.  Influenza vaccine given after consent obtained.   He is up to date on Td vaccine   Separate significant chronic issues discussed: Discussed diet, BMI, need for diet and exercise changes as he is eating a fair amount of junk food.  discussed lifestyle change to improve on weight, BP.  Leukopenia - stable for years, but if lower today, consider hematology consult   Yamato was seen today for annual exam.  Diagnoses and all orders for this visit:  Encounter for health maintenance examination in adult -     Comprehensive metabolic panel -     CBC with Differential -     Lipid panel  Need for influenza vaccination -     Flu Vaccine QUAD 6+ mos PF IM (Fluarix Quad PF)  Leukopenia, unspecified type -     CBC with Differential  BMI 29.0-29.9,adult    Follow-up pending labs,  yearly for physical

## 2019-02-10 NOTE — Patient Instructions (Signed)
 Health Maintenance, Male Adopting a healthy lifestyle and getting preventive care are important in promoting health and wellness. Ask your health care provider about:  The right schedule for you to have regular tests and exams.  Things you can do on your own to prevent diseases and keep yourself healthy. What should I know about diet, weight, and exercise? Eat a healthy diet   Eat a diet that includes plenty of vegetables, fruits, low-fat dairy products, and lean protein.  Do not eat a lot of foods that are high in solid fats, added sugars, or sodium. Maintain a healthy weight Body mass index (BMI) is a measurement that can be used to identify possible weight problems. It estimates body fat based on height and weight. Your health care provider can help determine your BMI and help you achieve or maintain a healthy weight. Get regular exercise Get regular exercise. This is one of the most important things you can do for your health. Most adults should:  Exercise for at least 150 minutes each week. The exercise should increase your heart rate and make you sweat (moderate-intensity exercise).  Do strengthening exercises at least twice a week. This is in addition to the moderate-intensity exercise.  Spend less time sitting. Even light physical activity can be beneficial. Watch cholesterol and blood lipids Have your blood tested for lipids and cholesterol at 43 years of age, then have this test every 5 years. You may need to have your cholesterol levels checked more often if:  Your lipid or cholesterol levels are high.  You are older than 43 years of age.  You are at high risk for heart disease. What should I know about cancer screening? Many types of cancers can be detected early and may often be prevented. Depending on your health history and family history, you may need to have cancer screening at various ages. This may include screening for:  Colorectal cancer.  Prostate  cancer.  Skin cancer.  Lung cancer. What should I know about heart disease, diabetes, and high blood pressure? Blood pressure and heart disease  High blood pressure causes heart disease and increases the risk of stroke. This is more likely to develop in people who have high blood pressure readings, are of African descent, or are overweight.  Talk with your health care provider about your target blood pressure readings.  Have your blood pressure checked: ? Every 3-5 years if you are 18-39 years of age. ? Every year if you are 40 years old or older.  If you are between the ages of 65 and 75 and are a current or former smoker, ask your health care provider if you should have a one-time screening for abdominal aortic aneurysm (AAA). Diabetes Have regular diabetes screenings. This checks your fasting blood sugar level. Have the screening done:  Once every three years after age 45 if you are at a normal weight and have a low risk for diabetes.  More often and at a younger age if you are overweight or have a high risk for diabetes. What should I know about preventing infection? Hepatitis B If you have a higher risk for hepatitis B, you should be screened for this virus. Talk with your health care provider to find out if you are at risk for hepatitis B infection. Hepatitis C Blood testing is recommended for:  Everyone born from 1945 through 1965.  Anyone with known risk factors for hepatitis C. Sexually transmitted infections (STIs)  You should be screened each   year for STIs, including gonorrhea and chlamydia, if: ? You are sexually active and are younger than 43 years of age. ? You are older than 43 years of age and your health care provider tells you that you are at risk for this type of infection. ? Your sexual activity has changed since you were last screened, and you are at increased risk for chlamydia or gonorrhea. Ask your health care provider if you are at risk.  Ask your  health care provider about whether you are at high risk for HIV. Your health care provider may recommend a prescription medicine to help prevent HIV infection. If you choose to take medicine to prevent HIV, you should first get tested for HIV. You should then be tested every 3 months for as long as you are taking the medicine. Follow these instructions at home: Lifestyle  Do not use any products that contain nicotine or tobacco, such as cigarettes, e-cigarettes, and chewing tobacco. If you need help quitting, ask your health care provider.  Do not use street drugs.  Do not share needles.  Ask your health care provider for help if you need support or information about quitting drugs. Alcohol use  Do not drink alcohol if your health care provider tells you not to drink.  If you drink alcohol: ? Limit how much you have to 0-2 drinks a day. ? Be aware of how much alcohol is in your drink. In the [place] one drink equals one 12 oz bottle of beer (355 mL), one 5 oz glass of wine (148 mL), or one 1 oz glass of hard liquor (44 mL). General instructions  Schedule regular health, dental, and eye exams.  Stay current with your vaccines.  Tell your health care provider if: ? You often feel depressed. ? You have ever been abused or do not feel safe at home. Summary  Adopting a healthy lifestyle and getting preventive care are important in promoting health and wellness.  Follow your health care provider's instructions about healthy diet, exercising, and getting tested or screened for diseases.  Follow your health care provider's instructions on monitoring your cholesterol and blood pressure. This information is not intended to replace advice given to you by your health care provider. Make sure you discuss any questions you have with your health care provider. Document Released: 09/09/2007 Document Revised: 03/06/2018 Document Reviewed: 03/06/2018 Elsevier Patient Education  2020 Elsevier  Inc.     Why follow it? Research shows. . Those who follow the Mediterranean diet have a reduced risk of heart disease  . The diet is associated with a reduced incidence of Parkinson's and Alzheimer's diseases . People following the diet may have longer life expectancies and lower rates of chronic diseases  . The Dietary Guidelines for Americans recommends the Mediterranean diet as an eating plan to promote health and prevent disease  What Is the Mediterranean Diet?  . Healthy eating plan based on typical foods and recipes of Mediterranean-style cooking . The diet is primarily a plant based diet; these foods should make up a majority of meals   Starches - Plant based foods should make up a majority of meals - They are an important sources of vitamins, minerals, energy, antioxidants, and fiber - Choose whole grains, foods high in fiber and minimally processed items  - Typical grain sources include wheat, oats, barley, corn, brown rice, bulgar, farro, millet, polenta, couscous  - Various types of beans include chickpeas, lentils, fava beans, black beans, white beans     Fruits  Veggies - Large quantities of antioxidant rich fruits & veggies; 6 or more servings  - Vegetables can be eaten raw or lightly drizzled with oil and cooked  - Vegetables common to the traditional Mediterranean Diet include: artichokes, arugula, beets, broccoli, brussel sprouts, cabbage, carrots, celery, collard greens, cucumbers, eggplant, kale, leeks, lemons, lettuce, mushrooms, okra, onions, peas, peppers, potatoes, pumpkin, radishes, rutabaga, shallots, spinach, sweet potatoes, turnips, zucchini - Fruits common to the Mediterranean Diet include: apples, apricots, avocados, cherries, clementines, dates, figs, grapefruits, grapes, melons, nectarines, oranges, peaches, pears, pomegranates, strawberries, tangerines  Fats - Replace butter and margarine with healthy oils, such as olive oil, canola oil, and tahini  - Limit nuts  to no more than a handful a day  - Nuts include walnuts, almonds, pecans, pistachios, pine nuts  - Limit or avoid candied, honey roasted or heavily salted nuts - Olives are central to the Mediterranean diet - can be eaten whole or used in a variety of dishes   Meats Protein - Limiting red meat: no more than a few times a month - When eating red meat: choose lean cuts and keep the portion to the size of deck of cards - Eggs: approx. 0 to 4 times a week  - Fish and lean poultry: at least 2 a week  - Healthy protein sources include, chicken, turkey, lean beef, lamb - Increase intake of seafood such as tuna, salmon, trout, mackerel, shrimp, scallops - Avoid or limit high fat processed meats such as sausage and bacon  Dairy - Include moderate amounts of low fat dairy products  - Focus on healthy dairy such as fat free yogurt, skim milk, low or reduced fat cheese - Limit dairy products higher in fat such as whole or 2% milk, cheese, ice cream  Alcohol - Moderate amounts of red wine is ok  - No more than 5 oz daily for women (all ages) and men older than age 65  - No more than 10 oz of wine daily for men younger than 65  Other - Limit sweets and other desserts  - Use herbs and spices instead of salt to flavor foods  - Herbs and spices common to the traditional Mediterranean Diet include: basil, bay leaves, chives, cloves, cumin, fennel, garlic, lavender, marjoram, mint, oregano, parsley, pepper, rosemary, sage, savory, sumac, tarragon, thyme   It's not just a diet, it's a lifestyle:  . The Mediterranean diet includes lifestyle factors typical of those in the region  . Foods, drinks and meals are best eaten with others and savored . Daily physical activity is important for overall good health . This could be strenuous exercise like running and aerobics . This could also be more leisurely activities such as walking, housework, yard-work, or taking the stairs . Moderation is the key; a balanced and  healthy diet accommodates most foods and drinks . Consider portion sizes and frequency of consumption of certain foods   Meal Ideas & Options:  . Breakfast:  o Whole wheat toast or whole wheat English muffins with peanut butter & hard boiled egg o Steel cut oats topped with apples & cinnamon and skim milk  o Fresh fruit: banana, strawberries, melon, berries, peaches  o Smoothies: strawberries, bananas, greek yogurt, peanut butter o Low fat greek yogurt with blueberries and granola  o Egg white omelet with spinach and mushrooms o Breakfast couscous: whole wheat couscous, apricots, skim milk, cranberries  . Sandwiches:  o Hummus and grilled vegetables (peppers, zucchini, squash) on   whole wheat bread   o Grilled chicken on whole wheat pita with lettuce, tomatoes, cucumbers or tzatziki  o Tuna salad on whole wheat bread: tuna salad made with greek yogurt, olives, red peppers, capers, green onions o Garlic rosemary lamb pita: lamb sauted with garlic, rosemary, salt & pepper; add lettuce, cucumber, greek yogurt to pita - flavor with lemon juice and black pepper  . Seafood:  o Mediterranean grilled salmon, seasoned with garlic, basil, parsley, lemon juice and black pepper o Shrimp, lemon, and spinach whole-grain pasta salad made with low fat greek yogurt  o Seared scallops with lemon orzo  o Seared tuna steaks seasoned salt, pepper, coriander topped with tomato mixture of olives, tomatoes, olive oil, minced garlic, parsley, green onions and cappers  . Meats:  o Herbed greek chicken salad with kalamata olives, cucumber, feta  o Red bell peppers stuffed with spinach, bulgur, lean ground beef (or lentils) & topped with feta   o Kebabs: skewers of chicken, tomatoes, onions, zucchini, squash  o [place] burgers: made with red onions, mint, dill, lemon juice, feta cheese topped with roasted red peppers . Vegetarian o Cucumber salad: cucumbers, artichoke hearts, celery, red onion, feta cheese, tossed in  olive oil & lemon juice  o Hummus and whole grain pita points with a greek salad (lettuce, tomato, feta, olives, cucumbers, red onion) o Lentil soup with celery, carrots made with vegetable broth, garlic, salt and pepper  o Tabouli salad: parsley, bulgur, mint, scallions, cucumbers, tomato, radishes, lemon juice, olive oil, salt and pepper.

## 2019-02-11 LAB — CBC WITH DIFFERENTIAL/PLATELET
Basophils Absolute: 0 10*3/uL (ref 0.0–0.2)
Basos: 1 %
EOS (ABSOLUTE): 0 10*3/uL (ref 0.0–0.4)
Eos: 1 %
Hematocrit: 46.8 % (ref 37.5–51.0)
Hemoglobin: 16 g/dL (ref 13.0–17.7)
Immature Grans (Abs): 0 10*3/uL (ref 0.0–0.1)
Immature Granulocytes: 0 %
Lymphocytes Absolute: 1.9 10*3/uL (ref 0.7–3.1)
Lymphs: 57 %
MCH: 29.5 pg (ref 26.6–33.0)
MCHC: 34.2 g/dL (ref 31.5–35.7)
MCV: 86 fL (ref 79–97)
Monocytes Absolute: 0.5 10*3/uL (ref 0.1–0.9)
Monocytes: 14 %
Neutrophils Absolute: 0.9 10*3/uL — ABNORMAL LOW (ref 1.4–7.0)
Neutrophils: 27 %
Platelets: 195 10*3/uL (ref 150–450)
RBC: 5.43 x10E6/uL (ref 4.14–5.80)
RDW: 14 % (ref 11.6–15.4)
WBC: 3.3 10*3/uL — ABNORMAL LOW (ref 3.4–10.8)

## 2019-02-11 LAB — COMPREHENSIVE METABOLIC PANEL
ALT: 35 IU/L (ref 0–44)
AST: 68 IU/L — ABNORMAL HIGH (ref 0–40)
Albumin/Globulin Ratio: 1.5 (ref 1.2–2.2)
Albumin: 4.5 g/dL (ref 4.0–5.0)
Alkaline Phosphatase: 88 IU/L (ref 39–117)
BUN/Creatinine Ratio: 12 (ref 9–20)
BUN: 14 mg/dL (ref 6–24)
Bilirubin Total: 0.5 mg/dL (ref 0.0–1.2)
CO2: 24 mmol/L (ref 20–29)
Calcium: 10.1 mg/dL (ref 8.7–10.2)
Chloride: 98 mmol/L (ref 96–106)
Creatinine, Ser: 1.15 mg/dL (ref 0.76–1.27)
GFR calc Af Amer: 90 mL/min/{1.73_m2} (ref >59)
GFR calc non Af Amer: 78 mL/min/{1.73_m2} (ref >59)
Globulin, Total: 3.1 g/dL (ref 1.5–4.5)
Glucose: 93 mg/dL (ref 65–99)
Potassium: 4 mmol/L (ref 3.5–5.2)
Sodium: 138 mmol/L (ref 134–144)
Total Protein: 7.6 g/dL (ref 6.0–8.5)

## 2019-02-11 LAB — LIPID PANEL
Chol/HDL Ratio: 4.8 ratio (ref 0.0–5.0)
Cholesterol, Total: 252 mg/dL — ABNORMAL HIGH (ref 100–199)
HDL: 52 mg/dL (ref >39)
LDL Chol Calc (NIH): 180 mg/dL — ABNORMAL HIGH (ref 0–99)
Triglycerides: 110 mg/dL (ref 0–149)
VLDL Cholesterol Cal: 20 mg/dL (ref 5–40)

## 2019-02-13 NOTE — Progress Notes (Signed)
Troy Lang has been asked to add test.

## 2019-02-17 ENCOUNTER — Other Ambulatory Visit: Payer: Self-pay | Admitting: Medical

## 2019-02-17 DIAGNOSIS — R7989 Other specified abnormal findings of blood chemistry: Secondary | ICD-10-CM

## 2019-02-18 DIAGNOSIS — R7989 Other specified abnormal findings of blood chemistry: Secondary | ICD-10-CM | POA: Insufficient documentation

## 2019-02-19 ENCOUNTER — Encounter: Payer: Self-pay | Admitting: Medical

## 2019-02-25 LAB — HEPATITIS PANEL, ACUTE
Hep A IgM: NEGATIVE
Hep B C IgM: NEGATIVE
Hep C Virus Ab: <0.1 s/co ratio (ref 0.0–0.9)
Hepatitis B Surface Ag: NEGATIVE

## 2019-02-25 LAB — SPECIMEN STATUS REPORT

## 2019-03-10 ENCOUNTER — Other Ambulatory Visit: Payer: Self-pay

## 2019-03-13 ENCOUNTER — Ambulatory Visit
Admission: RE | Admit: 2019-03-13 | Discharge: 2019-03-13 | Disposition: A | Discharge status: Home / Self Care | Payer: BC Managed Care – PPO | Source: Ambulatory Visit | Attending: Medical | Admitting: Medical

## 2019-03-13 DIAGNOSIS — R7989 Other specified abnormal findings of blood chemistry: Secondary | ICD-10-CM

## 2019-03-20 ENCOUNTER — Encounter: Payer: Self-pay | Admitting: Physician Assistant

## 2019-03-20 ENCOUNTER — Telehealth: Payer: Self-pay

## 2019-03-20 DIAGNOSIS — R7989 Other specified abnormal findings of blood chemistry: Secondary | ICD-10-CM

## 2019-03-20 NOTE — Telephone Encounter (Signed)
-----   Message from Carlena Hurl, PA-C sent at 03/13/2019 10:19 PM EST ----- Liver ultrasound shows possibly fatty liver but not definitive.   Lets refer to gastroenterology for further eval.   Try Dr. Benson Norway if he is covered by his insurance

## 2019-04-10 ENCOUNTER — Ambulatory Visit: Payer: BC Managed Care – PPO | Admitting: Physician Assistant

## 2019-04-10 ENCOUNTER — Other Ambulatory Visit: Payer: Self-pay

## 2019-04-10 ENCOUNTER — Other Ambulatory Visit (INDEPENDENT_AMBULATORY_CARE_PROVIDER_SITE_OTHER): Payer: BC Managed Care – PPO

## 2019-04-10 ENCOUNTER — Encounter: Payer: Self-pay | Admitting: Physician Assistant

## 2019-04-10 VITALS — BP 98/64 | HR 88 | Temp 98.1°F | Ht 75.0 in | Wt 227.0 lb

## 2019-04-10 DIAGNOSIS — R7989 Other specified abnormal findings of blood chemistry: Secondary | ICD-10-CM

## 2019-04-10 DIAGNOSIS — R932 Abnormal findings on diagnostic imaging of liver and biliary tract: Secondary | ICD-10-CM | POA: Diagnosis not present

## 2019-04-10 LAB — HEPATIC FUNCTION PANEL
ALT: 21 U/L (ref 0–53)
AST: 16 U/L (ref 0–37)
Albumin: 4.6 g/dL (ref 3.5–5.2)
Alkaline Phosphatase: 59 U/L (ref 39–117)
Bilirubin, Direct: 0.2 mg/dL (ref 0.0–0.3)
Total Bilirubin: 1.2 mg/dL (ref 0.2–1.2)
Total Protein: 7.6 g/dL (ref 6.0–8.3)

## 2019-04-10 NOTE — Patient Instructions (Signed)
If you are age 44 or older, your body mass index should be between 23-30. Your Body mass index is 28.37 kg/m. If this is out of the aforementioned range listed, please consider follow up with your Primary Care Provider.  If you are age 21 or younger, your body mass index should be between 19-25. Your Body mass index is 28.37 kg/m. If this is out of the aformentioned range listed, please consider follow up with your Primary Care Provider.   Your provider has requested that you go to the basement level for lab work before leaving today. Press "B" on the elevator. The lab is located at the first door on the left as you exit the elevator.  You have been scheduled for an abdominal ultrasound at [facility] (1st floor of hospital) on Wednesday 04/16/19 at 9:30 am. Please arrive 15 minutes prior to your appointment for registration. Make certain not to have anything to eat or drink 6 hours prior to your appointment. Should you need to reschedule your appointment, please contact radiology at [phone]. This test typically takes about 30 minutes to perform.

## 2019-04-10 NOTE — Progress Notes (Signed)
Chief Complaint: Elevated LFTs  HPI:    Troy Lang is a 44 year old AA male from [place] with a past medical history as listed below, who was referred to me by Carlena Hurl, PA-C for a complaint of elevated LFTs.      02/10/2019 CMP with an AST of 68 and otherwise normal.  (1 year prior bilirubin minimally elevated at 1.4).  CBC with a white count minimally decreased at 3.3.  (Appears around patient's baseline).  Hepatitis panel negative.    03/13/2019 right upper quadrant ultrasound with mildly heterogenous hepatic parenchymal echotexture.  This is a nonspecific finding which may reflect hepatic steatosis or other hepatic parenchymal disease.  Recommend ultrasound elastography for further evaluation.  Otherwise unremarkable.    Today, the patient presents to clinic and explains that he is feeling well.  He has no complaints.  He takes no medications or supplements.  Occasionally uses an aspirin for headache.  Denies family history of liver problems.  Tells me he has been very worried about this elevated liver enzyme.    Social history positive for his family moving back to [place], his wife and 3 children who are 42 years old, 1 years old and 44 years old, they moved back about 3 years ago.    Denies fever, chills, weight loss, anorexia, nausea, vomiting, heartburn, reflux, abdominal pain or change in bowel habits.  Past Medical History:  Diagnosis Date  . Abnormal EKG 12/2010   cardiac eval Dr. Tollie Eth  . Abnormal EKG 12/2010   echo and EKG done to eval T wave inversions for preop;  Dr. Tollie Eth  . Cervical disc disease 2012   s/p discectomy, Dr. Rolena Infante  . Edema of upper extremity 11/2010   hospitalization for edema  . Elevated blood-pressure reading without diagnosis of hypertension 2013   improved with weight loss  . Former smoker   . Hyperlipidemia     Past Surgical History:  Procedure Laterality Date  . CERVICAL DISCECTOMY  2012   Dr. Melina Schools    No current  outpatient medications on file.   No current facility-administered medications for this visit.    Allergies as of 04/10/2019  . (No Known Allergies)    Family History  Problem Relation Age of Onset  . Diabetes Mother   . Hypertension Mother   . Diabetes Father   . Hypertension Father   . Diabetes Brother   . Stroke Brother   . Diabetes Brother   . Cancer Neg Hx   . COPD Neg Hx   . Heart disease Neg Hx     Social History   Socioeconomic History  . Marital status: Married    Spouse name: Not on file  . Number of children: Not on file  . Years of education: Not on file  . Highest education level: Not on file  Occupational History  . Occupation: Chief Strategy Officer: [employer]  Tobacco Use  . Smoking status: Former Smoker    Packs/day: 0.50    Years: 10.00    Pack years: 5.00    Quit date: 09/23/2010    Years since quitting: 8.5  . Smokeless tobacco: Never Used  . Tobacco comment: pt states that he has smoke one or 2 after that  Substance and Sexual Activity  . Alcohol use: No    Comment: stopped drinking in 2009  . Drug use: No  . Sexual activity: Not on file  Other Topics Concern  .  Not on file  Social History Narrative   Married, 3 children.   Some exercise, is a Naval architect.   Muslim, prior work as Production designer, theatre/television/film.  Originally from [place].  Exercise 2 + days per week, biking.  01/2019   Social Determinants of Health   Financial Resource Strain:   . Difficulty of Paying Living Expenses: Not on file  Food Insecurity:   . Worried About Programme researcher, broadcasting/film/video in the Last Year: Not on file  . Ran Out of Food in the Last Year: Not on file  Transportation Needs:   . Lack of Transportation (Medical): Not on file  . Lack of Transportation (Non-Medical): Not on file  Physical Activity:   . Days of Exercise per Week: Not on file  . Minutes of Exercise per Session: Not on file  Stress:   . Feeling of Stress : Not on file  Social Connections:     . Frequency of Communication with Friends and Family: Not on file  . Frequency of Social Gatherings with Friends and Family: Not on file  . Attends Religious Services: Not on file  . Active Member of Clubs or Organizations: Not on file  . Attends Banker Meetings: Not on file  . Marital Status: Not on file  Intimate Partner Violence:   . Fear of Current or Ex-Partner: Not on file  . Emotionally Abused: Not on file  . Physically Abused: Not on file  . Sexually Abused: Not on file    Review of Systems:    Constitutional: No weight loss, fever or chills Skin: No rash Cardiovascular: No chest pain Respiratory: No SOB  Gastrointestinal: See HPI and otherwise negative Genitourinary: No dysuria Neurological: No headache Musculoskeletal: No new muscle or joint pain Hematologic: No bleeding  Psychiatric: No history of depression or anxiety   Physical Exam:  Vital signs: BP 98/64   Pulse 88   Temp 98.1 F (36.7 C)   Ht 6\' 3"  (1.905 m)   Wt 227 lb (103 kg)   BMI 28.37 kg/m   Constitutional:   Pleasant AA male appears to be in NAD, Well developed, Well nourished, alert and cooperative Head:  Normocephalic and atraumatic. Eyes:   PEERL, EOMI. No icterus. Conjunctiva pink. Ears:  Normal auditory acuity. Neck:  Supple Throat: Oral cavity and pharynx without inflammation, swelling or lesion.  Respiratory: Respirations even and unlabored. Lungs clear to auscultation bilaterally.   No wheezes, crackles, or rhonchi.  Cardiovascular: Normal S1, S2. No MRG. Regular rate and rhythm. No peripheral edema, cyanosis or pallor.  Gastrointestinal:  Soft, nondistended, nontender. No rebound or guarding. Normal bowel sounds. No appreciable masses or hepatomegaly. Rectal:  Not performed.  Msk:  Symmetrical without gross deformities. Without edema, no deformity or joint abnormality.  Neurologic:  Alert and  oriented x4;  grossly normal neurologically.  Skin:   Dry and intact without  significant lesions or rashes. Psychiatric: Demonstrates good judgement and reason without abnormal affect or behaviors.  MOST RECENT LABS AND IMAGING: CBC    Component Value Date/Time   WBC 3.3 (L) 02/10/2019 0928   WBC 3.2 (L) 01/04/2015 0001   RBC 5.43 02/10/2019 0928   RBC 4.95 01/04/2015 0001   HGB 16.0 02/10/2019 0928   HCT 46.8 02/10/2019 0928   PLT 195 02/10/2019 0928   MCV 86 02/10/2019 0928   MCH 29.5 02/10/2019 0928   MCH 30.3 01/04/2015 0001   MCHC 34.2 02/10/2019 0928   MCHC 35.0 01/04/2015 0001  RDW 14.0 02/10/2019 0928   LYMPHSABS 1.9 02/10/2019 0928   MONOABS 0.4 12/14/2011 0935   EOSABS 0.0 02/10/2019 0928   BASOSABS 0.0 02/10/2019 0928    CMP     Component Value Date/Time   NA 138 02/10/2019 0928   K 4.0 02/10/2019 0928   CL 98 02/10/2019 0928   CO2 24 02/10/2019 0928   GLUCOSE 93 02/10/2019 0928   GLUCOSE 95 01/04/2015 0001   BUN 14 02/10/2019 0928   CREATININE 1.15 02/10/2019 0928   CREATININE 0.84 01/04/2015 0001   CALCIUM 10.1 02/10/2019 0928   PROT 7.6 02/10/2019 0928   ALBUMIN 4.5 02/10/2019 0928   AST 68 (H) 02/10/2019 0928   ALT 35 02/10/2019 0928   ALKPHOS 88 02/10/2019 0928   BILITOT 0.5 02/10/2019 0928   GFRNONAA 78 02/10/2019 0928   GFRAA 90 02/10/2019 0928    Assessment: 1.  Elevated AST: Minimal elevation of AST in November, this is the only elevation, hepatitis panel negative; most likely fatty liver 2.  Abnormal right upper quadrant ultrasound: With question of fatty liver most likely, but could not fully identify and recommended elastography  Plan: 1.  Scheduled patient for right upper quadrant ultrasound with elastography. 2.  Repeat hepatic panel today. 3.  Discussed fatty liver the patient today.  Recommendations for slow and steady weight loss of 1 to 2 pounds per week.  Patient tells me he is trying to watch his diet because he was told his cholesterol was high. 4.  Patient to follow in clinic with me per  recommendations after labs above.  He was assigned to Dr. Leone Payor this morning.  Hyacinth Meeker, PA-C [facility] 04/10/2019, 8:43 AM  Cc: Jac Canavan, PA-C

## 2019-04-16 ENCOUNTER — Ambulatory Visit (HOSPITAL_COMMUNITY)
Admission: RE | Admit: 2019-04-16 | Discharge: 2019-04-16 | Disposition: A | Discharge status: Home / Self Care | Payer: BC Managed Care – PPO | Source: Ambulatory Visit | Attending: Physician Assistant | Admitting: Physician Assistant

## 2019-04-16 ENCOUNTER — Other Ambulatory Visit: Payer: Self-pay

## 2019-04-16 DIAGNOSIS — R7989 Other specified abnormal findings of blood chemistry: Secondary | ICD-10-CM | POA: Diagnosis not present

## 2019-04-16 DIAGNOSIS — R932 Abnormal findings on diagnostic imaging of liver and biliary tract: Secondary | ICD-10-CM | POA: Diagnosis present

## 2019-10-20 ENCOUNTER — Ambulatory Visit: Payer: BC Managed Care – PPO | Attending: Internal Medicine

## 2019-10-20 DIAGNOSIS — Z20822 Contact with and (suspected) exposure to covid-19: Secondary | ICD-10-CM

## 2019-10-21 LAB — NOVEL CORONAVIRUS, NAA: SARS-CoV-2, NAA: NOT DETECTED

## 2019-10-21 LAB — SARS-COV-2, NAA 2 DAY TAT

## 2021-07-28 IMAGING — US US ABDOMEN LIMITED
1 series · 14 of 25 positions shown · non-contrast
Comparison: CT angiogram chest 01/03/2011

CLINICAL DATA: Elevated LFTs.

EXAM:
ULTRASOUND ABDOMEN LIMITED RIGHT UPPER QUADRANT

[Series 1: us abdomen limited · 0.17mm/px · 14 of 46 slices shown]
[im 1/46]
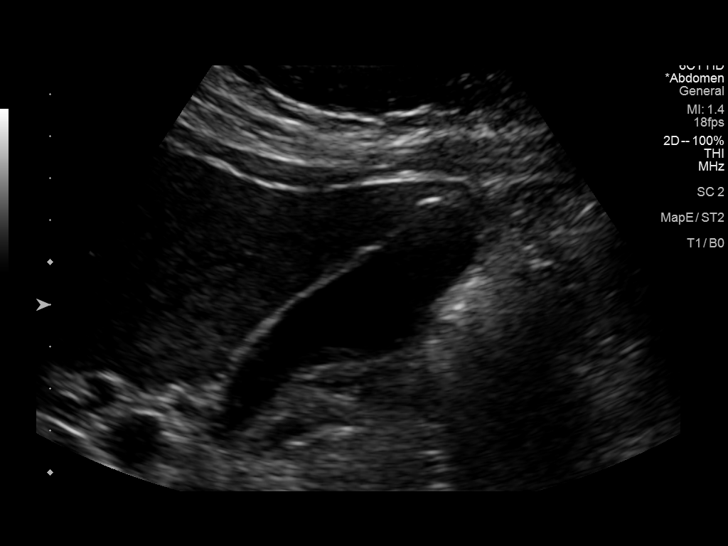
[im 4/46]
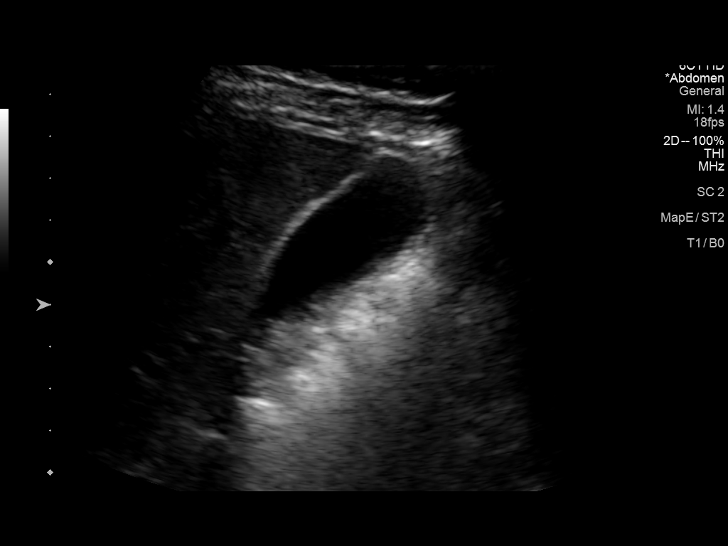
[im 8/46]
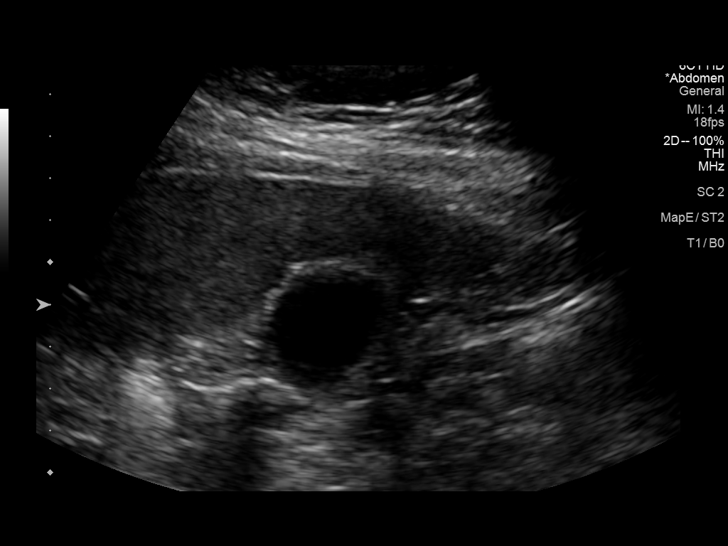
[im 12/46]
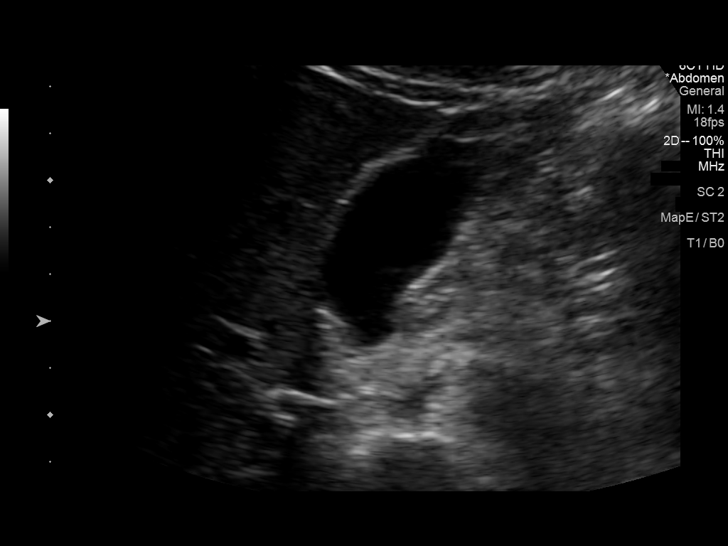
[im 16/46]
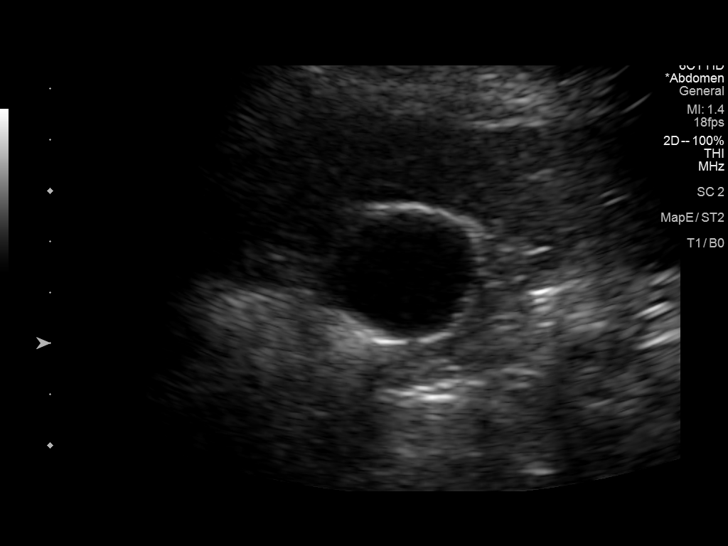
[im 17/46]
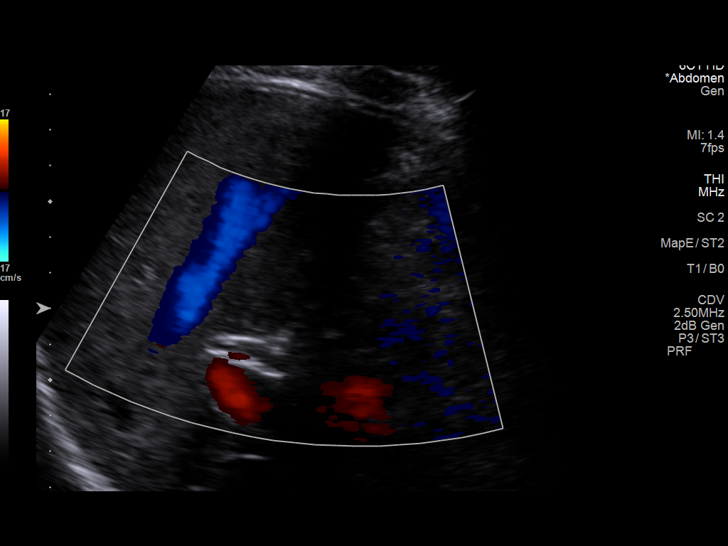
[im 21/46]
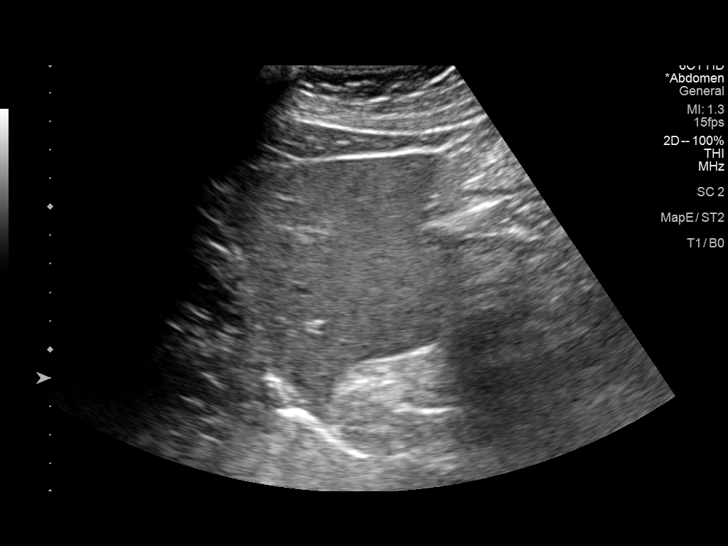
[im 25/46]
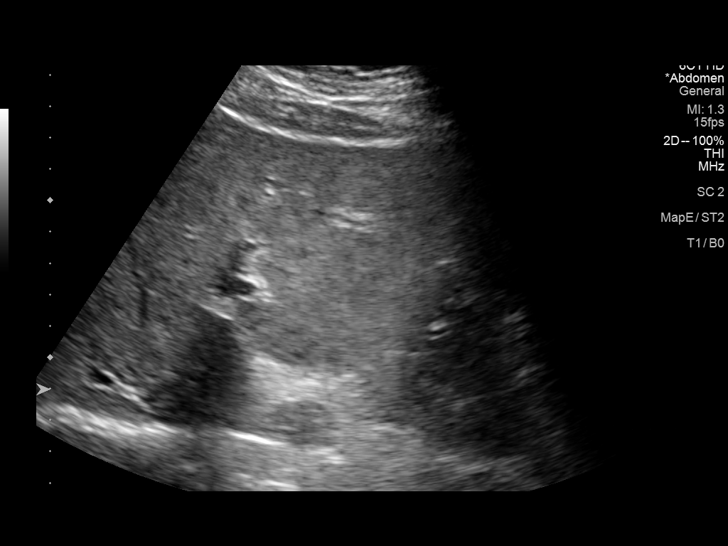
[im 29/46]
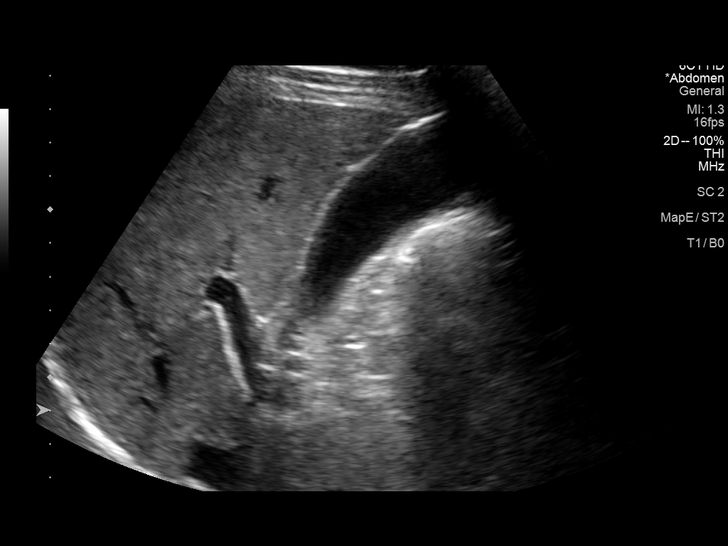
[im 31/46]
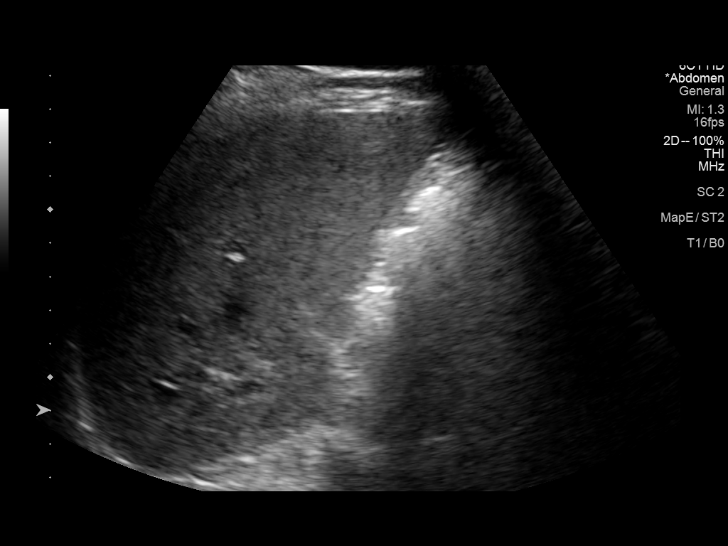
[im 34/46]
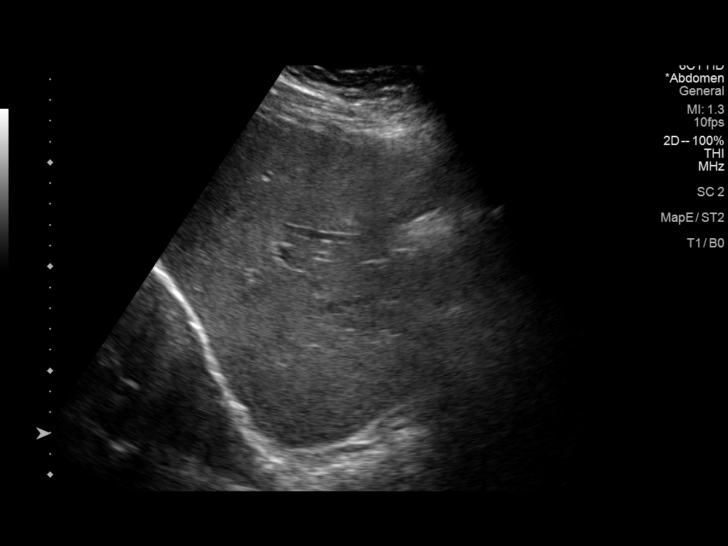
[im 38/46]
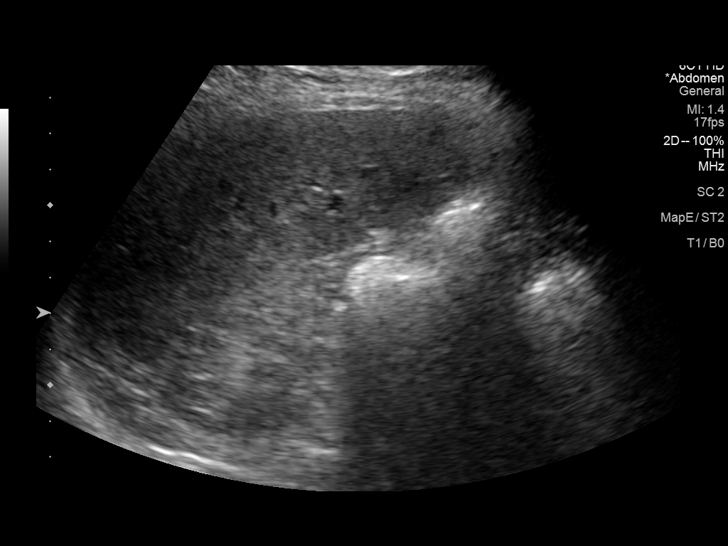
[im 42/46]
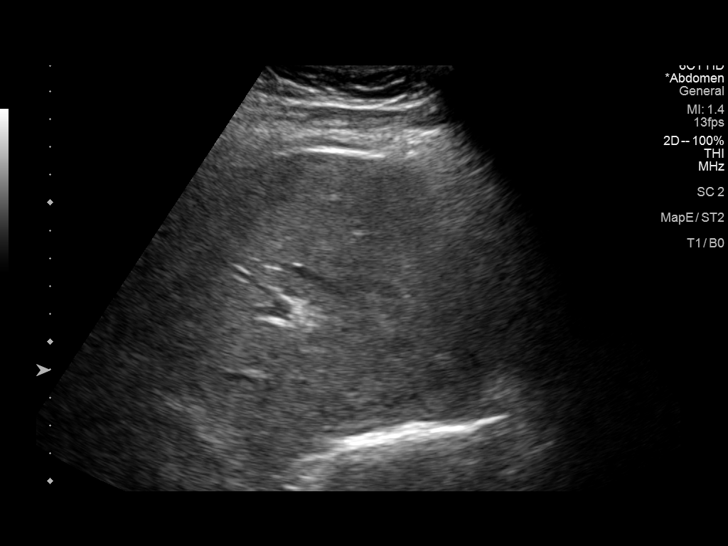
[im 46/46]
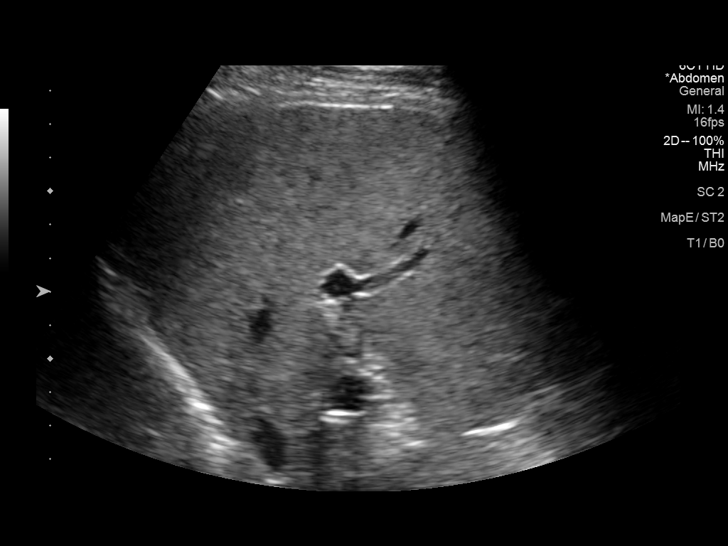

[14 of 25 positions shown; findings below may reference images not displayed]

FINDINGS: Gallbladder:

No gallstones or wall thickening visualized. No sonographic Murphy
sign noted by sonographer.

Common bile duct:

Diameter: 5 mm, within normal limits.

Liver:

No focal lesion identified. Mildly heterogeneous hepatic parenchymal
echotexture. The interrogated portal vein is patent on color Doppler
imaging with normal direction of blood flow towards the liver.
IMPRESSION: Mildly heterogeneous hepatic parenchymal echotexture. This is a
nonspecific finding which may reflect hepatic steatosis or other
hepatic parenchymal disease. Consider ultrasound elastography for
further evaluation, as clinically warranted.

Otherwise unremarkable right upper quadrant ultrasound, as detailed.

## 2021-08-31 IMAGING — US US ABDOMEN LIMITED W/ ELASTOGRAPHY
1 series · 12 of 25 positions shown · non-contrast
Comparison: None.

CLINICAL DATA: Elevated liver function studies.

EXAM:
US ABDOMEN LIMITED - RIGHT UPPER QUADRANT
ULTRASOUND HEPATIC ELASTOGRAPHY
TECHNIQUE: Sonography of the right upper quadrant was performed. In addition,
ultrasound elastography evaluation of the liver was performed. A
region of interest was placed within the right lobe of the liver.
Following application of a compressive sonographic pulse, tissue
compressibility was assessed. Multiple assessments were performed at
the selected site. Median tissue compressibility was determined.
Previously, hepatic stiffness was assessed by shear wave velocity.
Based on recently published [employer] in [employer]
consensus article, reporting is now recommended to be performed in
the SI units of pressure (kiloPascals) representing hepatic
stiffness/elasticity. The obtained result is compared to the
published reference standards. (cACLD= compensated Advanced Chronic
Liver Disease)

[Series 1: us abdomen limited w/ elastography · 12 of 59 slices shown]
[im 3/59]
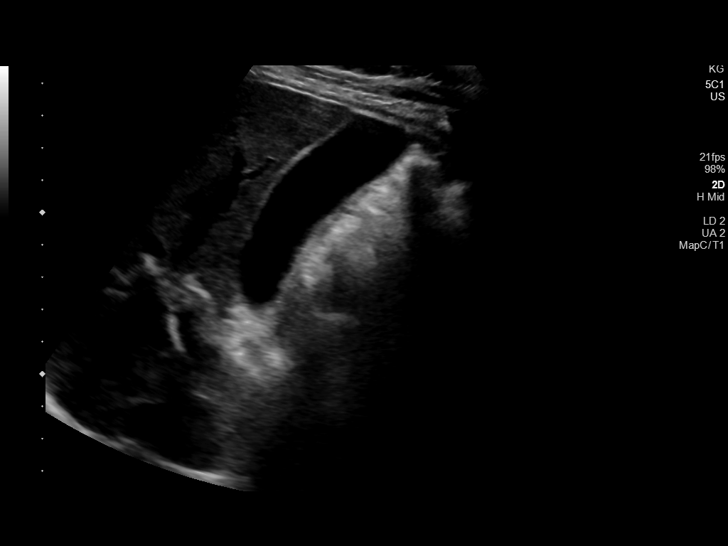
[im 8/59]
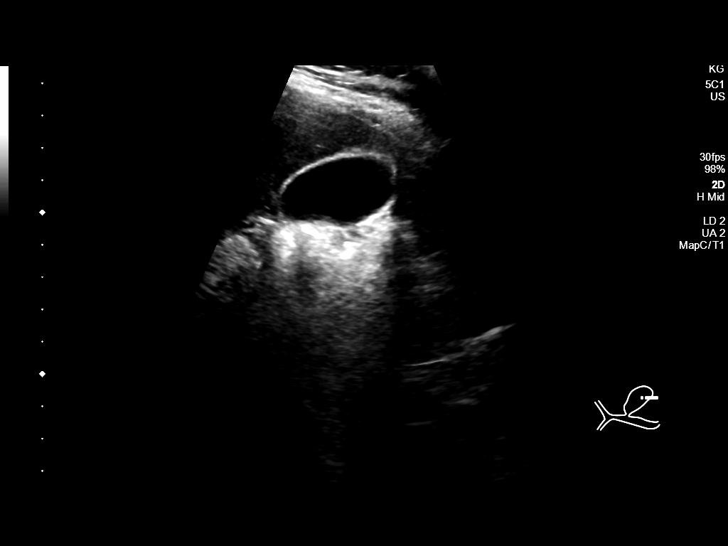
[im 13/59]
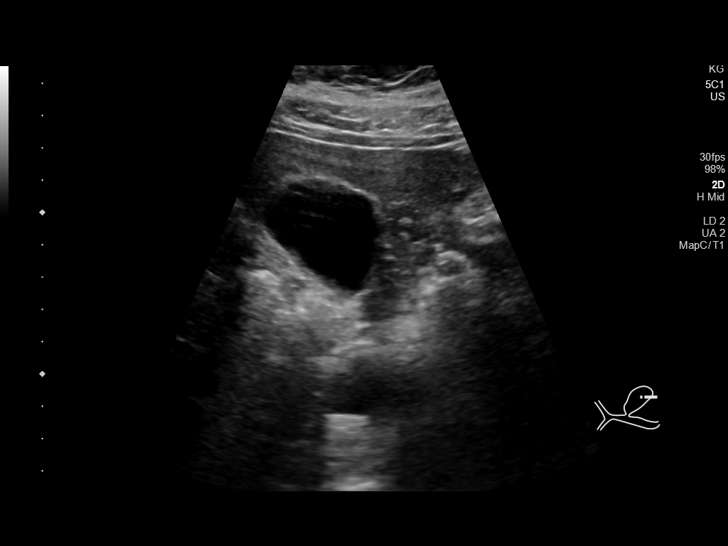
[im 17/59]
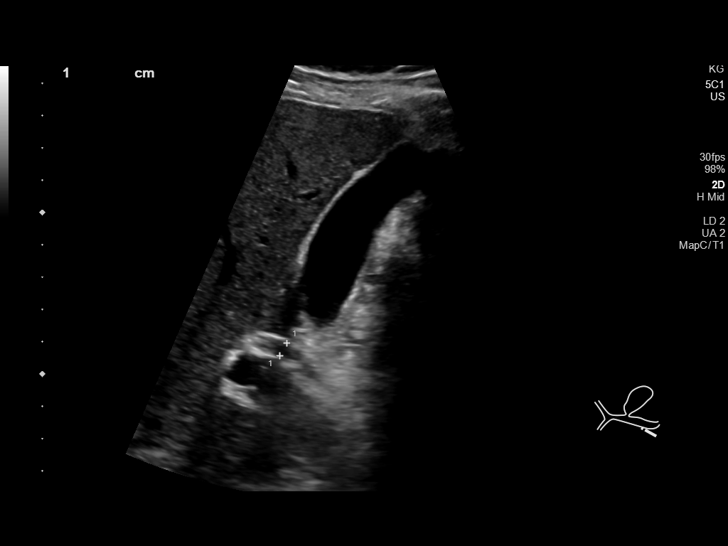
[im 22/59]
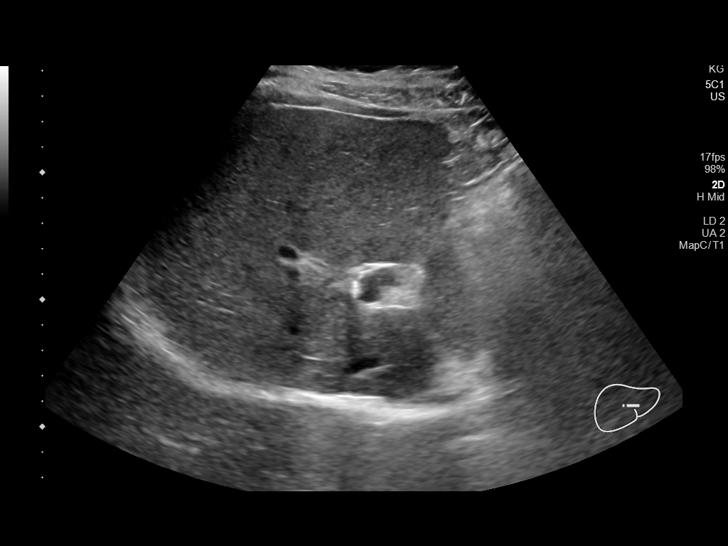
[im 27/59]
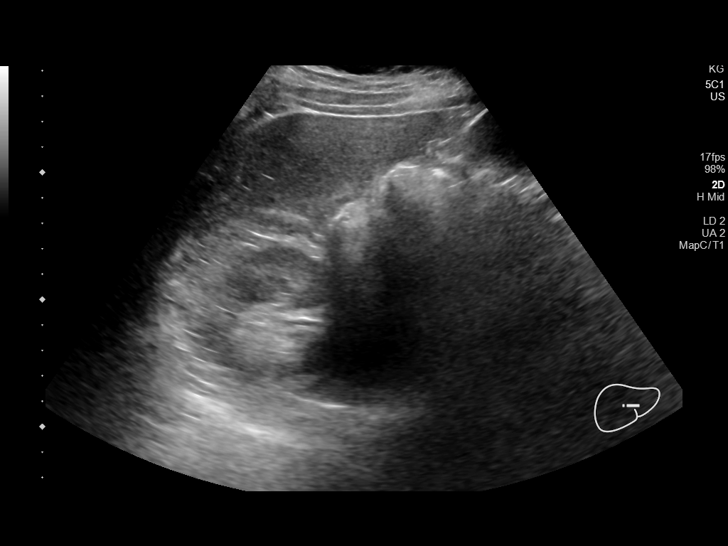
[im 32/59]
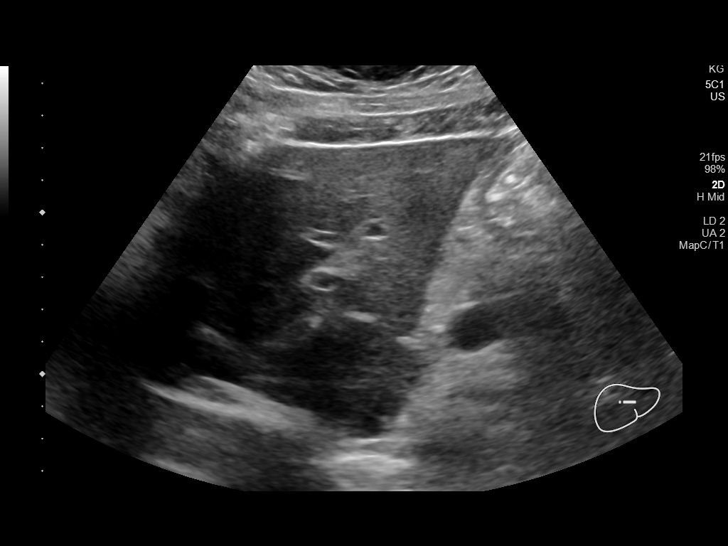
[im 37/59]
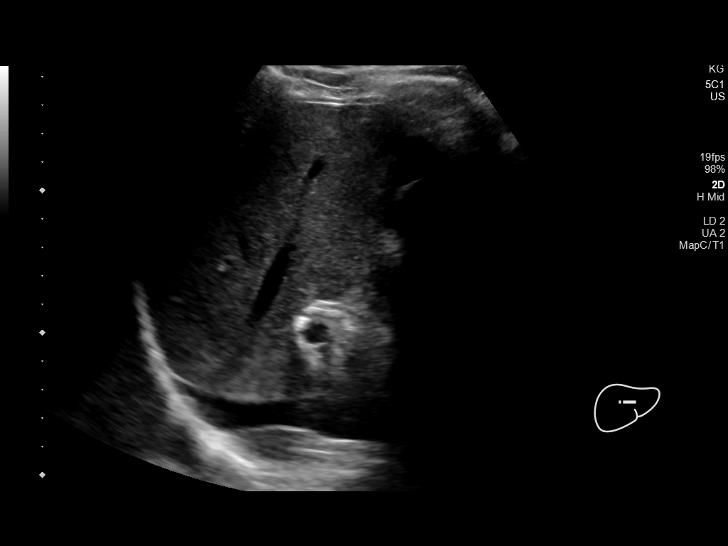
[im 42/59]
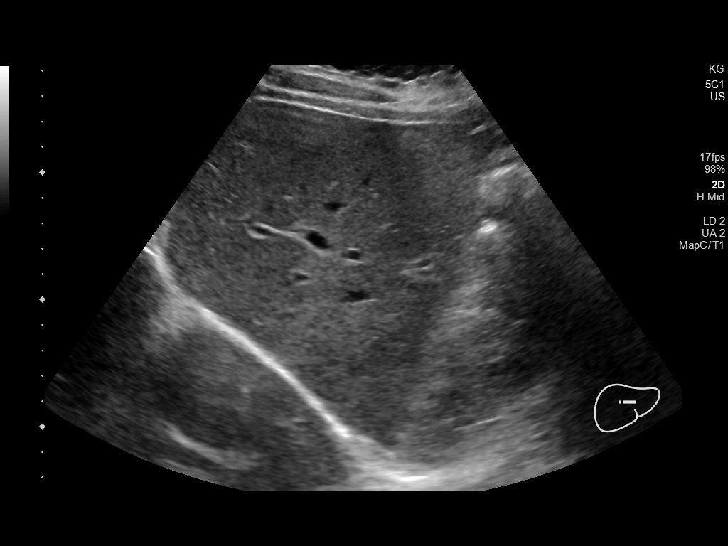
[im 46/59]
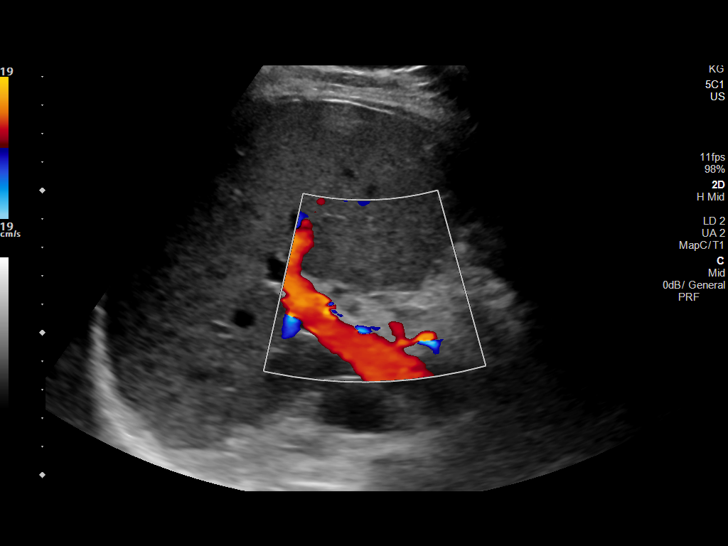
[im 51/59]
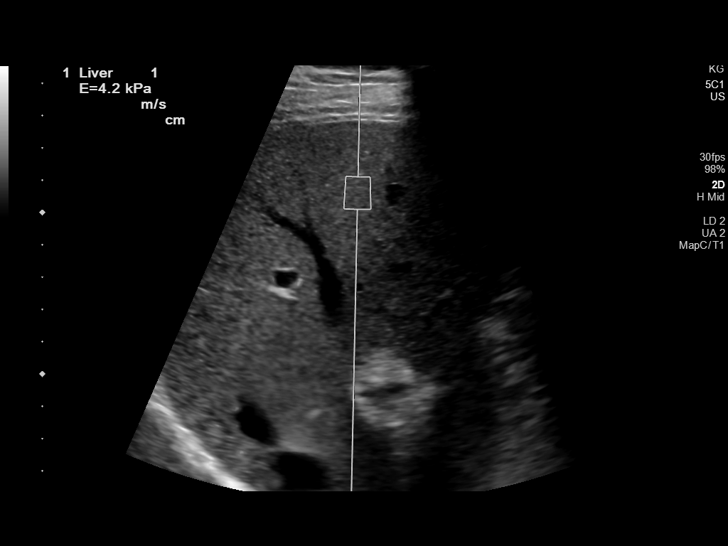
[im 56/59]
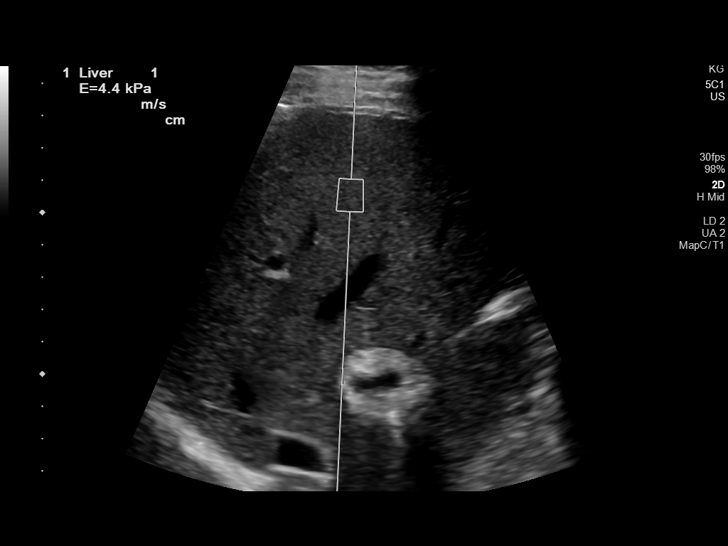

[12 of 25 positions shown; findings below may reference images not displayed]

FINDINGS: ULTRASOUND ABDOMEN LIMITED RIGHT UPPER QUADRANT

Gallbladder:

No gallstones or wall thickening visualized. No sonographic Murphy
sign noted.

Common bile duct:

Diameter: 4.5 mm

Liver:

No focal lesion identified. Within normal limits in parenchymal
echogenicity. Portal vein is patent on color Doppler imaging with
normal direction of blood flow towards the liver.

ULTRASOUND HEPATIC ELASTOGRAPHY

Device: Siemens Helix VTQ

Patient position: Supine

Transducer [id number]

Number of measurements: 10

Hepatic segment:  8

Median kPa:

IQR:

IQR/Median kPa ratio:

Data quality:  Good

Diagnostic category:  < or = 5 kPa: high probability of being normal
IMPRESSION: ULTRASOUND RUQ:

Unremarkable right upper quadrant ultrasound examination.

ULTRASOUND HEPATIC ELASTOGRAPHY:

Median kPa:

Diagnostic category:  < or = 5 kPa: high probability of being normal

The use of hepatic elastography is applicable to patients with viral
hepatitis and non-alcoholic fatty liver disease. At this time, there
is insufficient data for the referenced cut-off values and use in
other causes of liver disease, including alcoholic liver disease.
Patients, however, may be assessed by elastography and serve as
their own reference standard/baseline.

In patients with non-alcoholic liver disease, the values suggesting
compensated advanced chronic liver disease (cACLD) may be lower, and
patients may need additional testing with elasticity results of [DATE]
kPa.

Please note that abnormal hepatic elasticity and shear wave
velocities may also be identified in clinical settings other than
with hepatic fibrosis, such as: acute hepatitis, elevated right
heart and central venous pressures including use of beta blockers,
Ferienhaus disease ([place]), infiltrative processes such as
mastocytosis/amyloidosis/infiltrative tumor/lymphoma, extrahepatic
cholestasis, with hyperemia in the post-prandial state, and with
liver transplantation. Correlation with patient history, laboratory
data, and clinical condition recommended.

Diagnostic Categories:

< or =5 kPa: high probability of being normal

< or =9 kPa: in the absence of other known clinical signs, rules [DATE] kPa and ?13 kPa: suggestive of cACLD, but needs further testing

>13 kPa: highly suggestive of cACLD

< or =17 kPa: highly suggestive of cACLD with an increased
probability of clinically significant portal hypertension

## 2021-11-30 ENCOUNTER — Encounter: Payer: Self-pay | Admitting: Internal Medicine

## 2022-01-03 ENCOUNTER — Encounter: Payer: Self-pay | Admitting: Internal Medicine
